# Patient Record
Sex: Male | Born: 1950 | Race: White | Hispanic: No | Marital: Married | Smoking: Never smoker
Health system: Southern US, Community
[De-identification: ages and names within clinical notes are randomized; demographics above are authoritative.]

## PROBLEM LIST (undated history)

## (undated) DIAGNOSIS — Z9889 Other specified postprocedural states: Secondary | ICD-10-CM

## (undated) DIAGNOSIS — G4730 Sleep apnea, unspecified: Secondary | ICD-10-CM

## (undated) DIAGNOSIS — Z8679 Personal history of other diseases of the circulatory system: Secondary | ICD-10-CM

## (undated) DIAGNOSIS — K5790 Diverticulosis of intestine, part unspecified, without perforation or abscess without bleeding: Secondary | ICD-10-CM

## (undated) DIAGNOSIS — K229 Disease of esophagus, unspecified: Secondary | ICD-10-CM

## (undated) DIAGNOSIS — G4733 Obstructive sleep apnea (adult) (pediatric): Secondary | ICD-10-CM

## (undated) DIAGNOSIS — I10 Essential (primary) hypertension: Secondary | ICD-10-CM

## (undated) DIAGNOSIS — N400 Enlarged prostate without lower urinary tract symptoms: Secondary | ICD-10-CM

## (undated) DIAGNOSIS — H9192 Unspecified hearing loss, left ear: Secondary | ICD-10-CM

## (undated) DIAGNOSIS — D126 Benign neoplasm of colon, unspecified: Secondary | ICD-10-CM

## (undated) DIAGNOSIS — E785 Hyperlipidemia, unspecified: Secondary | ICD-10-CM

## (undated) DIAGNOSIS — R112 Nausea with vomiting, unspecified: Secondary | ICD-10-CM

## (undated) DIAGNOSIS — E039 Hypothyroidism, unspecified: Secondary | ICD-10-CM

## (undated) DIAGNOSIS — R011 Cardiac murmur, unspecified: Secondary | ICD-10-CM

## (undated) HISTORY — DX: Hypothyroidism, unspecified: E03.9

## (undated) HISTORY — DX: Benign prostatic hyperplasia without lower urinary tract symptoms: N40.0

## (undated) HISTORY — PX: TONSILLECTOMY: SUR1361

## (undated) HISTORY — DX: Other specified postprocedural states: Z98.890

## (undated) HISTORY — DX: Unspecified hearing loss, left ear: H91.92

## (undated) HISTORY — DX: Nausea with vomiting, unspecified: R11.2

## (undated) HISTORY — PX: WISDOM TOOTH EXTRACTION: SHX21

## (undated) HISTORY — DX: Disease of esophagus, unspecified: K22.9

## (undated) HISTORY — DX: Benign neoplasm of colon, unspecified: D12.6

## (undated) HISTORY — DX: Essential (primary) hypertension: I10

## (undated) HISTORY — PX: MIDDLE EAR SURGERY: SHX713

## (undated) HISTORY — PX: HERNIA REPAIR: SHX51

## (undated) HISTORY — DX: Diverticulosis of intestine, part unspecified, without perforation or abscess without bleeding: K57.90

## (undated) HISTORY — DX: Obstructive sleep apnea (adult) (pediatric): G47.33

## (undated) HISTORY — PX: OTHER SURGICAL HISTORY: SHX169

## (undated) HISTORY — PX: ORCHIECTOMY: SHX2116

## (undated) HISTORY — PX: KNEE ARTHROSCOPY: SUR90

## (undated) HISTORY — PX: INNER EAR SURGERY: SHX679

## (undated) HISTORY — DX: Cardiac murmur, unspecified: R01.1

## (undated) HISTORY — DX: Hyperlipidemia, unspecified: E78.5

## (undated) HISTORY — DX: Sleep apnea, unspecified: G47.30

## (undated) HISTORY — DX: Personal history of other diseases of the circulatory system: Z86.79

---

## 1997-11-20 ENCOUNTER — Encounter: Payer: Self-pay | Admitting: Gastroenterology

## 1997-11-20 ENCOUNTER — Ambulatory Visit (HOSPITAL_COMMUNITY): Admission: RE | Admit: 1997-11-20 | Discharge: 1997-11-20 | Payer: Self-pay | Admitting: Gastroenterology

## 1997-11-27 ENCOUNTER — Encounter: Payer: Self-pay | Admitting: Gastroenterology

## 1998-04-24 HISTORY — PX: FLEXIBLE SIGMOIDOSCOPY: SHX1649

## 2002-02-04 ENCOUNTER — Encounter: Payer: Self-pay | Admitting: Internal Medicine

## 2004-10-31 ENCOUNTER — Ambulatory Visit: Payer: Self-pay | Admitting: Internal Medicine

## 2004-12-05 ENCOUNTER — Ambulatory Visit (HOSPITAL_BASED_OUTPATIENT_CLINIC_OR_DEPARTMENT_OTHER): Admission: RE | Admit: 2004-12-05 | Discharge: 2004-12-05 | Payer: Self-pay | Admitting: Internal Medicine

## 2004-12-07 ENCOUNTER — Ambulatory Visit: Payer: Self-pay | Admitting: Pulmonary Disease

## 2005-01-03 ENCOUNTER — Ambulatory Visit: Payer: Self-pay | Admitting: Internal Medicine

## 2005-01-06 ENCOUNTER — Ambulatory Visit: Payer: Self-pay | Admitting: Pulmonary Disease

## 2005-01-16 ENCOUNTER — Ambulatory Visit: Payer: Self-pay | Admitting: Internal Medicine

## 2005-02-07 ENCOUNTER — Ambulatory Visit: Payer: Self-pay | Admitting: Internal Medicine

## 2005-07-03 ENCOUNTER — Ambulatory Visit: Payer: Self-pay | Admitting: Internal Medicine

## 2005-07-21 ENCOUNTER — Ambulatory Visit: Payer: Self-pay | Admitting: Internal Medicine

## 2005-11-02 ENCOUNTER — Ambulatory Visit: Payer: Self-pay | Admitting: Internal Medicine

## 2005-11-14 ENCOUNTER — Ambulatory Visit: Payer: Self-pay | Admitting: Internal Medicine

## 2006-03-20 ENCOUNTER — Ambulatory Visit: Payer: Self-pay | Admitting: Internal Medicine

## 2006-03-20 LAB — CONVERTED CEMR LAB: TSH: 6.67 microintl units/mL — ABNORMAL HIGH (ref 0.35–5.50)

## 2006-04-05 ENCOUNTER — Ambulatory Visit: Payer: Self-pay | Admitting: Internal Medicine

## 2006-06-28 ENCOUNTER — Ambulatory Visit: Payer: Self-pay | Admitting: Internal Medicine

## 2006-10-12 ENCOUNTER — Telehealth (INDEPENDENT_AMBULATORY_CARE_PROVIDER_SITE_OTHER): Payer: Self-pay | Admitting: *Deleted

## 2006-11-06 ENCOUNTER — Encounter (INDEPENDENT_AMBULATORY_CARE_PROVIDER_SITE_OTHER): Payer: Self-pay | Admitting: *Deleted

## 2006-11-06 ENCOUNTER — Ambulatory Visit: Payer: Self-pay | Admitting: Internal Medicine

## 2006-11-06 DIAGNOSIS — R7989 Other specified abnormal findings of blood chemistry: Secondary | ICD-10-CM | POA: Insufficient documentation

## 2006-11-06 LAB — CONVERTED CEMR LAB
Bilirubin Urine: NEGATIVE
Specific Gravity, Urine: <1.005
Urobilinogen, UA: NEGATIVE
WBC Urine, dipstick: NEGATIVE
pH: 5

## 2006-11-16 ENCOUNTER — Encounter (INDEPENDENT_AMBULATORY_CARE_PROVIDER_SITE_OTHER): Payer: Self-pay | Admitting: *Deleted

## 2006-11-19 ENCOUNTER — Telehealth: Payer: Self-pay | Admitting: Internal Medicine

## 2007-01-21 ENCOUNTER — Telehealth (INDEPENDENT_AMBULATORY_CARE_PROVIDER_SITE_OTHER): Payer: Self-pay | Admitting: *Deleted

## 2007-01-22 ENCOUNTER — Ambulatory Visit: Payer: Self-pay | Admitting: Internal Medicine

## 2007-04-09 ENCOUNTER — Ambulatory Visit: Payer: Self-pay | Admitting: Internal Medicine

## 2007-04-22 ENCOUNTER — Encounter (INDEPENDENT_AMBULATORY_CARE_PROVIDER_SITE_OTHER): Payer: Self-pay | Admitting: *Deleted

## 2007-04-25 HISTORY — PX: COLONOSCOPY W/ POLYPECTOMY: SHX1380

## 2007-04-25 LAB — CONVERTED CEMR LAB
Cholesterol: 187 mg/dL (ref 0–200)
TSH: 4.35 microintl units/mL (ref 0.35–5.50)

## 2007-04-26 ENCOUNTER — Encounter (INDEPENDENT_AMBULATORY_CARE_PROVIDER_SITE_OTHER): Payer: Self-pay | Admitting: *Deleted

## 2007-05-08 ENCOUNTER — Telehealth (INDEPENDENT_AMBULATORY_CARE_PROVIDER_SITE_OTHER): Payer: Self-pay | Admitting: *Deleted

## 2007-06-28 ENCOUNTER — Ambulatory Visit: Payer: Self-pay | Admitting: Internal Medicine

## 2007-07-01 ENCOUNTER — Encounter (INDEPENDENT_AMBULATORY_CARE_PROVIDER_SITE_OTHER): Payer: Self-pay | Admitting: *Deleted

## 2007-07-02 ENCOUNTER — Encounter: Payer: Self-pay | Admitting: Internal Medicine

## 2007-07-05 ENCOUNTER — Ambulatory Visit: Payer: Self-pay | Admitting: Internal Medicine

## 2007-07-05 DIAGNOSIS — E039 Hypothyroidism, unspecified: Secondary | ICD-10-CM | POA: Insufficient documentation

## 2007-07-08 ENCOUNTER — Encounter (INDEPENDENT_AMBULATORY_CARE_PROVIDER_SITE_OTHER): Payer: Self-pay | Admitting: *Deleted

## 2007-10-16 ENCOUNTER — Ambulatory Visit: Payer: Self-pay | Admitting: Internal Medicine

## 2007-10-28 ENCOUNTER — Encounter (INDEPENDENT_AMBULATORY_CARE_PROVIDER_SITE_OTHER): Payer: Self-pay | Admitting: *Deleted

## 2007-10-28 LAB — CONVERTED CEMR LAB
ALT: 37 units/L (ref 0–53)
AST: 32 units/L (ref 0–37)
Bilirubin, Direct: 0.1 mg/dL (ref 0.0–0.3)
Cholesterol: 174 mg/dL (ref 0–200)
HDL: 47.5 mg/dL (ref >39.0)
Total Protein: 7.2 g/dL (ref 6.0–8.3)
VLDL: 10 mg/dL (ref 0–40)

## 2007-11-08 ENCOUNTER — Ambulatory Visit: Payer: Self-pay | Admitting: Internal Medicine

## 2007-11-08 ENCOUNTER — Encounter (INDEPENDENT_AMBULATORY_CARE_PROVIDER_SITE_OTHER): Payer: Self-pay | Admitting: *Deleted

## 2007-11-08 DIAGNOSIS — E785 Hyperlipidemia, unspecified: Secondary | ICD-10-CM | POA: Insufficient documentation

## 2007-11-22 ENCOUNTER — Encounter (INDEPENDENT_AMBULATORY_CARE_PROVIDER_SITE_OTHER): Payer: Self-pay | Admitting: *Deleted

## 2007-11-22 ENCOUNTER — Ambulatory Visit: Payer: Self-pay | Admitting: Internal Medicine

## 2007-11-22 LAB — CONVERTED CEMR LAB: OCCULT 2: NEGATIVE

## 2007-12-09 ENCOUNTER — Telehealth: Payer: Self-pay | Admitting: Internal Medicine

## 2007-12-24 ENCOUNTER — Telehealth (INDEPENDENT_AMBULATORY_CARE_PROVIDER_SITE_OTHER): Payer: Self-pay | Admitting: *Deleted

## 2008-01-13 ENCOUNTER — Telehealth (INDEPENDENT_AMBULATORY_CARE_PROVIDER_SITE_OTHER): Payer: Self-pay | Admitting: *Deleted

## 2008-01-23 DIAGNOSIS — D126 Benign neoplasm of colon, unspecified: Secondary | ICD-10-CM

## 2008-01-23 HISTORY — DX: Benign neoplasm of colon, unspecified: D12.6

## 2008-02-10 ENCOUNTER — Ambulatory Visit: Payer: Self-pay | Admitting: Gastroenterology

## 2008-02-12 ENCOUNTER — Ambulatory Visit: Payer: Self-pay | Admitting: Gastroenterology

## 2008-02-12 ENCOUNTER — Encounter: Payer: Self-pay | Admitting: Gastroenterology

## 2008-02-13 ENCOUNTER — Encounter: Payer: Self-pay | Admitting: Gastroenterology

## 2008-02-14 ENCOUNTER — Telehealth: Payer: Self-pay | Admitting: Gastroenterology

## 2008-08-05 ENCOUNTER — Telehealth (INDEPENDENT_AMBULATORY_CARE_PROVIDER_SITE_OTHER): Payer: Self-pay | Admitting: *Deleted

## 2008-11-09 ENCOUNTER — Ambulatory Visit: Payer: Self-pay | Admitting: Internal Medicine

## 2008-11-09 DIAGNOSIS — B360 Pityriasis versicolor: Secondary | ICD-10-CM | POA: Insufficient documentation

## 2008-11-09 DIAGNOSIS — Z860100 Personal history of colon polyps, unspecified: Secondary | ICD-10-CM | POA: Insufficient documentation

## 2008-11-09 DIAGNOSIS — K5730 Diverticulosis of large intestine without perforation or abscess without bleeding: Secondary | ICD-10-CM | POA: Insufficient documentation

## 2008-11-09 DIAGNOSIS — R9431 Abnormal electrocardiogram [ECG] [EKG]: Secondary | ICD-10-CM | POA: Insufficient documentation

## 2008-11-18 ENCOUNTER — Encounter (INDEPENDENT_AMBULATORY_CARE_PROVIDER_SITE_OTHER): Payer: Self-pay | Admitting: *Deleted

## 2008-11-23 ENCOUNTER — Encounter (INDEPENDENT_AMBULATORY_CARE_PROVIDER_SITE_OTHER): Payer: Self-pay | Admitting: *Deleted

## 2009-11-12 ENCOUNTER — Ambulatory Visit: Payer: Self-pay | Admitting: Internal Medicine

## 2010-05-22 LAB — CONVERTED CEMR LAB
ALT: 22 units/L (ref 0–53)
AST: 22 units/L (ref 0–37)
AST: 30 units/L (ref 0–37)
Albumin: 4.2 g/dL (ref 3.5–5.2)
Alkaline Phosphatase: 50 units/L (ref 39–117)
BUN: 13 mg/dL (ref 6–23)
Basophils Absolute: 0 10*3/uL (ref 0.0–0.1)
Basophils Absolute: 0 10*3/uL (ref 0.0–0.1)
Basophils Relative: 0.2 % (ref 0.0–3.0)
Basophils Relative: 1.1 % (ref 0.0–3.0)
Bilirubin, Direct: 0 mg/dL (ref 0.0–0.3)
Bilirubin, Direct: 0.1 mg/dL (ref 0.0–0.3)
Blood in Urine, dipstick: NEGATIVE
CO2: 31 meq/L (ref 19–32)
CO2: 32 meq/L (ref 19–32)
Calcium: 8.8 mg/dL (ref 8.4–10.5)
Calcium: 9 mg/dL (ref 8.4–10.5)
Chloride: 105 meq/L (ref 96–112)
Cholesterol, target level: 200 mg/dL
Creatinine, Ser: 0.8 mg/dL (ref 0.4–1.5)
Creatinine, Ser: 1 mg/dL (ref 0.4–1.5)
Eosinophils Absolute: 0.1 10*3/uL (ref 0.0–0.6)
Eosinophils Absolute: 0.1 10*3/uL (ref 0.0–0.7)
Eosinophils Absolute: 0.1 10*3/uL (ref 0.0–0.7)
Eosinophils Relative: 1.9 % (ref 0.0–5.0)
Eosinophils Relative: 2 % (ref 0.0–5.0)
Eosinophils Relative: 2.7 % (ref 0.0–5.0)
GFR calc Af Amer: 100 mL/min
GFR calc Af Amer: 130 mL/min
GFR calc non Af Amer: 106.13 mL/min (ref >60)
GFR calc non Af Amer: 107 mL/min
GFR calc non Af Amer: 108.88 mL/min (ref >60)
GFR calc non Af Amer: 82 mL/min
Glucose, Bld: 100 mg/dL — ABNORMAL HIGH (ref 70–99)
HCT: 41.3 % (ref 39.0–52.0)
HCT: 42.3 % (ref 39.0–52.0)
HCT: 44.9 % (ref 39.0–52.0)
HDL: 54.5 mg/dL (ref >39.00)
Hemoglobin: 14.3 g/dL (ref 13.0–17.0)
Hemoglobin: 14.4 g/dL (ref 13.0–17.0)
Hemoglobin: 14.7 g/dL (ref 13.0–17.0)
Hgb A1c MFr Bld: 5.7 % (ref 4.6–6.0)
Hgb A1c MFr Bld: 5.9 % (ref 4.6–6.0)
Ketones, urine, test strip: NEGATIVE
LDL Cholesterol: 105 mg/dL — ABNORMAL HIGH (ref 0–99)
LDL Cholesterol: 96 mg/dL (ref 0–99)
Lymphocytes Relative: 27.8 % (ref 12.0–46.0)
Lymphocytes Relative: 28.9 % (ref 12.0–46.0)
Lymphs Abs: 1.5 10*3/uL (ref 0.7–4.0)
MCHC: 33.3 g/dL (ref 30.0–36.0)
MCHC: 34.4 g/dL (ref 30.0–36.0)
MCV: 95.7 fL (ref 78.0–100.0)
MCV: 98.7 fL (ref 78.0–100.0)
Monocytes Absolute: 0.5 10*3/uL (ref 0.1–1.0)
Monocytes Absolute: 0.6 10*3/uL (ref 0.2–0.7)
Monocytes Relative: 12.4 % — ABNORMAL HIGH (ref 3.0–12.0)
Monocytes Relative: 15 % — ABNORMAL HIGH (ref 3.0–12.0)
Neutro Abs: 2.1 10*3/uL (ref 1.4–7.7)
Neutro Abs: 2.2 10*3/uL (ref 1.4–7.7)
Neutro Abs: 2.6 10*3/uL (ref 1.4–7.7)
Neutrophils Relative %: 52.4 % (ref 43.0–77.0)
Neutrophils Relative %: 54.5 % (ref 43.0–77.0)
Neutrophils Relative %: 55.3 % (ref 43.0–77.0)
Nitrite: NEGATIVE
Platelets: 149 10*3/uL — ABNORMAL LOW (ref 150–400)
Potassium: 3.8 meq/L (ref 3.5–5.1)
Potassium: 4.2 meq/L (ref 3.5–5.1)
RBC: 4.25 M/uL (ref 4.22–5.81)
RBC: 4.36 M/uL (ref 4.22–5.81)
RBC: 4.54 M/uL (ref 4.22–5.81)
Sodium: 140 meq/L (ref 135–145)
Sodium: 141 meq/L (ref 135–145)
Sodium: 143 meq/L (ref 135–145)
TSH: 3.38 microintl units/mL (ref 0.35–5.50)
TSH: 4.11 microintl units/mL (ref 0.35–5.50)
TSH: 5.92 microintl units/mL — ABNORMAL HIGH (ref 0.35–5.50)
Total Bilirubin: 0.9 mg/dL (ref 0.3–1.2)
Total CHOL/HDL Ratio: 3
Total CHOL/HDL Ratio: 3
Total Protein: 6.9 g/dL (ref 6.0–8.3)
Total Protein: 7.4 g/dL (ref 6.0–8.3)
Triglycerides: 41 mg/dL (ref 0.0–149.0)
Triglycerides: 63 mg/dL (ref 0–149)
Urobilinogen, UA: 0.2
VLDL: 8.2 mg/dL (ref 0.0–40.0)
VLDL: 9 mg/dL (ref 0.0–40.0)
WBC: 3.8 10*3/uL — ABNORMAL LOW (ref 4.5–10.5)
WBC: 4 10*3/uL — ABNORMAL LOW (ref 4.5–10.5)
WBC: 4.8 10*3/uL (ref 4.5–10.5)
pH: 6

## 2010-05-24 NOTE — Assessment & Plan Note (Signed)
Summary: CPX/NS/KDC   Vital Signs:  Patient profile:   60 year old male Height:      75 inches Weight:      180.8 pounds BMI:     22.68 Temp:     98.3 degrees F oral Pulse rate:   84 / minute Resp:     16 per minute BP sitting:   118 / 70  (left arm) Cuff size:   large  Vitals Entered By: Shonna Chock CMA (November 12, 2009 11:03 AM) CC: CPX with fasting labs , Lipid Management Comments **Patient may have double Cardura and Parvachol, patient not sure if he took it x 2 on accident**   Primary Care Provider:  Marga Melnick, MD  CC:  CPX with fasting labs  and Lipid Management.  History of Present Illness: Mike Stephens is here for a physical; he is essentially asymptomatic . He did have a cough for a month (May) after attic exposure to Haematologist.  Lipid Management History:      Positive NCEP/ATP III risk factors include male age 84 years old or older.  Negative NCEP/ATP III risk factors include non-diabetic, no family history for ischemic heart disease, non-tobacco-user status, non-hypertensive, no ASHD (atherosclerotic heart disease), no prior stroke/TIA, no peripheral vascular disease, and no history of aortic aneurysm.     Preventive Screening-Counseling & Management  Alcohol-Tobacco     Smoking Status: never  Caffeine-Diet-Exercise     Does Patient Exercise: yes  Allergies: 1)  ! * General Anesthesia  Past History:  Past Medical History: Syncope due to seasickness 2002 Hyperlipidemia: LDL goal = < 100 based on NMR Lipoprofile;[place] Study  LDL  goal < 160 Hypothyroidism Benign prostatic hypertrophy Mitral valve prolapse, PMH of (Note: clinically NO mitral  regurgitation) Diverticulosis Colonic polyps, PMH  of Diverticulosis, colon  Past Surgical History: TM  surgery 1997 mastoid surgery   as child  age 89 radical surgery  post infection  L ear flex sigmoidectomy  2000 for rectal bleeding arthroscopy R knee orchiectomy for undescended  testicle  age 89 wisdom teeth extraction Tonsillectomy Hernia Surgery Colon polypectomy  : tubular adenoma 2009, Dr Russella Dar, due 2014  Family History: Father: Tb Mother:HTN , malaria  Siblings: negative 2 M uncles: MI , older than  6 yo M uncle: colon cancer  Social History: Heart healthy diet Alcohol Use - no Occupation:self employed Never Smoked Regular exercise-yes: 60 min every other day   Review of Systems General:  Denies chills, fever, sweats, and weight loss. Eyes:  Denies blurring, double vision, and vision loss-both eyes. ENT:  Denies difficulty swallowing and hoarseness; Absent hearing L. CV:  Denies chest pain or discomfort, leg cramps with exertion, palpitations, shortness of breath with exertion, swelling of feet, and swelling of hands. Resp:  Denies chest pain with inspiration, cough, coughing up blood, shortness of breath, sputum productive, and wheezing. GI:  Denies abdominal pain, bloody stools, dark tarry stools, and indigestion. GU:  Denies discharge, dysuria, and hematuria. MS:  Denies joint redness, joint swelling, low back pain, mid back pain, and thoracic pain; Occasional L shoulder  pain positionally . Derm:  Denies changes in nail beds, dryness, and lesion(s). Neuro:  Denies disturbances in coordination, numbness, poor balance, tingling, and weakness. Psych:  Denies anxiety and depression. Endo:  Denies cold intolerance, excessive hunger, excessive thirst, excessive urination, and heat intolerance. Heme:  Denies abnormal bruising and bleeding. Allergy:  Denies itching eyes and sneezing.  Physical Exam  General:  Thin,well-nourished, alert,appropriate  and cooperative throughout examination Head:  Normocephalic and atraumatic without obvious abnormalities.  Pattern  alopecia; beard & moustache Eyes:  No corneal or conjunctival inflammation noted. Perrla. Funduscopic exam benign, without hemorrhages, exudates or papilledema. Ears:  Small perforation L TM; severe  scarring R TM Nose:  External nasal examination shows no deformity or inflammation. Nasal mucosa are pink and moist without lesions or exudates. Mouth:  Oral mucosa and oropharynx without lesions or exudates.  Teeth in good repair. Neck:  No deformities, masses, or tenderness noted. Physiologic asymmetry Lungs:  Normal respiratory effort, chest expands symmetrically. Lungs are clear to auscultation, no crackles or wheezes. Heart:  normal rate, regular rhythm, no gallop, no rub, no JVD, no HJR, and grade1/2 -1   /6 systolic murmur @ apex.   Abdomen:  Bowel sounds positive,abdomen soft and non-tender without masses, organomegaly or hernias noted. Rectal:  No external abnormalities noted. Normal sphincter tone. No rectal masses or tenderness. Genitalia:   Single testicle  descended without nodularity, tenderness or masses. No scrotal masses or lesions. No penis lesions or urethral discharge. Prostate:  Prostate gland firm and smooth, no enlargement, nodularity, tenderness, mass, asymmetry or induration. Msk:  No deformity or scoliosis noted of thoracic or lumbar spine.   Pulses:  R and L carotid,radial,dorsalis pedis and posterior tibial pulses are full and equal bilaterally Extremities:  No clubbing, cyanosis, edema, or deformity noted with normal full range of motion of all joints.   Neurologic:  alert & oriented X3 and DTRs symmetrical and normal.   Skin:  Intact without suspicious lesions or rashes Cervical Nodes:  No lymphadenopathy noted Axillary Nodes:  No palpable lymphadenopathy Inguinal Nodes:  No significant adenopathy Psych:  memory intact for recent and remote, normally interactive, and good eye contact.     Impression & Recommendations:  Problem # 1:  ROUTINE GENERAL MEDICAL EXAM@HEALTH  CARE FACL (ICD-V70.0)  Orders: Specimen Handling (81191) UA Dipstick w/o Micro (manual) (81002) EKG w/ Interpretation (93000) Venipuncture (47829) TLB-Lipid Panel (80061-LIPID) TLB-BMP (Basic  Metabolic Panel-BMET) (80048-METABOL) TLB-CBC Platelet - w/Differential (85025-CBCD) TLB-Hepatic/Liver Function Pnl (80076-HEPATIC) TLB-TSH (Thyroid Stimulating Hormone) (84443-TSH) TLB-PSA (Prostate Specific Antigen) (84153-PSA) T-2 View CXR (71020TC)  Problem # 2:  COUGH (ICD-786.2)  post environmental exposures  Orders: T-2 View CXR (71020TC)  Problem # 3:  HYPERLIPIDEMIA (ICD-272.4)  His updated medication list for this problem includes:    Pravachol 40 Mg Tabs (Pravastatin sodium) .Marland Kitchen... 1 qhs  Problem # 4:  HYPOTHYROIDISM (ICD-244.9)  His updated medication list for this problem includes:    Levoxyl 25 Mcg Tabs (Levothyroxine sodium) .Marland Kitchen... 1.5 pills once daily except 1on weds  Problem # 5:  COLONIC POLYPS, HX OF (ICD-V12.72) as per Dr Russella Dar  Complete Medication List: 1)  Levoxyl 25 Mcg Tabs (Levothyroxine sodium) .... 1.5 pills once daily except 1on weds 2)  Cardura 8 Mg Tabs (Doxazosin mesylate) .... 1/4 tab qd 3)  Coq10 100 Mg Caps (Coenzyme q10) .Marland Kitchen.. 1 by mouth once daily 4)  Fish Oil 1200 Mg Caps (Omega-3 fatty acids) .Marland Kitchen.. 1 by mouth once daily 5)  Calcium 500mg   6)  Selenium  7)  Multivitamin  8)  Asa 325mg   9)  Fiber Qd-bid  10)  Pravachol 40 Mg Tabs (Pravastatin sodium) .Marland Kitchen.. 1 qhs 11)  Vitamin D3 2000 Unit Caps (Cholecalciferol) .Marland Kitchen.. 1 by mouth once daily 12)  Transderm-scop 1.5 Mg Pt72 (Scopolamine base) .... Apply patch 24 hours before procedure  Lipid Assessment/Plan:      Based on  NCEP/ATP III, the patient's risk factor category is "0-1 risk factors".  The patient's lipid goals are as follows: Total cholesterol goal is 200; LDL cholesterol goal is 100; HDL cholesterol goal is 40; Triglyceride goal is 150.  His LDL cholesterol goal has not been met.  Secondary causes for hyperlipidemia have been ruled out.  He has been counseled on adjunctive measures for lowering his cholesterol and has been provided with dietary instructions.    Patient  Instructions: 1)  Wear protective mask when in attic. Prescriptions: PRAVACHOL 40 MG  TABS (PRAVASTATIN SODIUM) 1 qhs  #90 x 3   Entered and Authorized by:   Marga Melnick MD   Signed by:   Marga Melnick MD on 11/12/2009   Method used:   Print then Give to Patient   RxID:   [id number] CARDURA 8 MG  TABS (DOXAZOSIN MESYLATE) 1/4 tab qd  #90 Each x 3   Entered and Authorized by:   Marga Melnick MD   Signed by:   Marga Melnick MD on 11/12/2009   Method used:   Print then Give to Patient   RxID:   [id number] LEVOXYL 25 MCG TABS (LEVOTHYROXINE SODIUM) 1.5 pills once daily EXCEPT 1on Weds  #90 Each x 3   Entered and Authorized by:   Marga Melnick MD   Signed by:   Marga Melnick MD on 11/12/2009   Method used:   Print then Give to Patient   RxID:   [id number]     Laboratory Results   Urine Tests    Routine Urinalysis   Glucose: negative   (Normal Range: Negative) Bilirubin: negative   (Normal Range: Negative) Ketone: negative   (Normal Range: Negative) Spec. Gravity: 1.015   (Normal Range: 1.003-1.035) Blood: negative   (Normal Range: Negative) pH: 6.0   (Normal Range: 5.0-8.0) Protein: negative   (Normal Range: Negative) Urobilinogen: 0.2   (Normal Range: 0-1) Nitrite: negative   (Normal Range: Negative) Leukocyte Esterace: negative   (Normal Range: Negative)

## 2010-09-09 NOTE — Procedures (Signed)
NAMEMARKOS, THEIL NO.:  [id number]   MEDICAL RECORD NO.:  [id number]          PATIENT TYPE:  OUT   LOCATION:  SLEEP CENTER                 FACILITY:  [facility]   PHYSICIAN:  Marcelyn Bruins, M.D. [facility] DATE OF BIRTH:  09-Nov-1950   DATE OF STUDY:  12/05/2004                              NOCTURNAL POLYSOMNOGRAM   DATE OF STUDY:  December 05, 2004.   REFERRING PHYSICIAN:  Dr. Marga Melnick.   INDICATION FOR STUDY:  Hypersomnia with a sleep apnea. Epworth score: 1.   SLEEP ARCHITECTURE:  The patient had total sleep time of 207 minutes with  significantly decreased slow wave sleep and REM. Sleep onset latency was  prolonged at 53 minutes and REM onset was prolonged at 145 minutes. Sleep  efficiency was only 56%.   IMPRESSION:  1.  Moderate obstructive sleep apnea/hypopnea syndrome with a respiratory      disturbance index of 25 events per hour and O2 desaturation as low as      87%. The events were not positional. Treatment for this degree of sleep      apnea may include weight loss alone, upper airway surgery, oral      appliance, and C-PAP. Clinical correlation is suggested.  2.  Moderate snoring noted throughout.  3.  No clinically significant cardiac arrhythmias.           ______________________________  Marcelyn Bruins, M.D. [facility]  Diplomate, American Board of Sleep  Medicine     KC/MEDQ  D:  12/08/2004 16:54:56  T:  12/08/2004 23:24:18  Job:  [id number]

## 2010-12-22 ENCOUNTER — Ambulatory Visit (INDEPENDENT_AMBULATORY_CARE_PROVIDER_SITE_OTHER): Payer: BC Managed Care – PPO | Admitting: Internal Medicine

## 2010-12-22 ENCOUNTER — Encounter: Payer: Self-pay | Admitting: Internal Medicine

## 2010-12-22 VITALS — BP 130/76 | HR 80 | Temp 97.9°F | Resp 12 | Ht 75.0 in | Wt 177.0 lb

## 2010-12-22 DIAGNOSIS — E039 Hypothyroidism, unspecified: Secondary | ICD-10-CM

## 2010-12-22 DIAGNOSIS — G4730 Sleep apnea, unspecified: Secondary | ICD-10-CM

## 2010-12-22 DIAGNOSIS — Z23 Encounter for immunization: Secondary | ICD-10-CM

## 2010-12-22 DIAGNOSIS — G4733 Obstructive sleep apnea (adult) (pediatric): Secondary | ICD-10-CM | POA: Insufficient documentation

## 2010-12-22 DIAGNOSIS — Z86010 Personal history of colonic polyps: Secondary | ICD-10-CM

## 2010-12-22 DIAGNOSIS — Z2911 Encounter for prophylactic immunotherapy for respiratory syncytial virus (RSV): Secondary | ICD-10-CM

## 2010-12-22 DIAGNOSIS — E785 Hyperlipidemia, unspecified: Secondary | ICD-10-CM

## 2010-12-22 DIAGNOSIS — Z0000 Encounter for general adult medical examination without abnormal findings: Secondary | ICD-10-CM

## 2010-12-22 LAB — LIPID PANEL
HDL: 55 mg/dL (ref >39.00)
LDL Cholesterol: 108 mg/dL — ABNORMAL HIGH (ref 0–99)
Total CHOL/HDL Ratio: 3
Triglycerides: 51 mg/dL (ref 0.0–149.0)

## 2010-12-22 LAB — BASIC METABOLIC PANEL
CO2: 31 mEq/L (ref 19–32)
Calcium: 9.4 mg/dL (ref 8.4–10.5)
Chloride: 103 mEq/L (ref 96–112)
Glucose, Bld: 116 mg/dL — ABNORMAL HIGH (ref 70–99)
Sodium: 141 mEq/L (ref 135–145)

## 2010-12-22 LAB — PSA: PSA: 0.93 ng/mL (ref 0.10–4.00)

## 2010-12-22 LAB — CBC WITH DIFFERENTIAL/PLATELET
Basophils Relative: 0.5 % (ref 0.0–3.0)
Eosinophils Relative: 1.7 % (ref 0.0–5.0)
Hemoglobin: 14.9 g/dL (ref 13.0–17.0)
Lymphocytes Relative: 30.2 % (ref 12.0–46.0)
MCV: 97.9 fl (ref 78.0–100.0)
Neutro Abs: 2.5 10*3/uL (ref 1.4–7.7)
Neutrophils Relative %: 56.2 % (ref 43.0–77.0)
RBC: 4.57 Mil/uL (ref 4.22–5.81)
WBC: 4.5 10*3/uL (ref 4.5–10.5)

## 2010-12-22 LAB — HEPATIC FUNCTION PANEL
Albumin: 4.7 g/dL (ref 3.5–5.2)
Total Protein: 7.3 g/dL (ref 6.0–8.3)

## 2010-12-22 NOTE — Progress Notes (Signed)
Subjective:    Patient ID: Mike Stephens, male    DOB: 1950/08/19, 60 y.o.   MRN: [id number]  HPI  Mike Stephens  is here for a physical; he denies acute issues.     Review of Systems Patient reports no  vision/ hearing changes,anorexia, weight change, fever ,adenopathy, persistant / recurrent hoarseness, swallowing issues, chest pain,palpitations, edema,persistant / recurrent cough, hemoptysis, dyspnea(rest, exertional, paroxysmal nocturnal), gastrointestinal  bleeding (melena, rectal bleeding), abdominal pain, excessive heart burn, GU symptoms( dysuria, hematuria, pyuria, voiding/incontinence  issues) syncope, focal weakness, memory loss,numbness & tingling, skin/hair/nail changes,depression, anxiety, abnormal bruising/bleeding, or  musculoskeletal symptoms/signs.      Objective:   Physical Exam Gen.: Thin but healthy and well-nourished in appearance. Alert, appropriate and cooperative throughout exam. Head: Normocephalic without obvious abnormalities; pattern alopecia ; moustache & beard Eyes: No corneal or conjunctival inflammation noted. Pupils equal round reactive to light and accommodation. Fundal exam is benign without hemorrhages, exudate, papilledema. Extraocular motion intact. Vision grossly normal with lenses. Ears: External  ear exam reveals no significant lesions or deformities. Canals clear .TMs scarred, L >R. Hearing is grossly decreased on L Nose: External nasal exam reveals no deformity or inflammation. Nasal mucosa are pink and moist. No lesions or exudates noted.  Mouth: Oral mucosa and oropharynx reveal no lesions or exudates. Teeth in good repair. Neck: No deformities, masses, or tenderness noted. Range of motion &. Thyroid normal. Lungs: Normal respiratory effort; chest expands symmetrically. Lungs are clear to auscultation without rales, wheezes, or increased work of breathing. Heart: Normal rate and rhythm. Normal S1 and S2. No gallop,  or rub. Apical click w/o   murmur. Abdomen: Bowel sounds normal; abdomen soft and nontender. No masses, organomegaly or hernias noted. Genitalia/DRE: single testicle present; left lobe of the prostate is minimally enlarged without nodules or induration. .                                                                                   Musculoskeletal/extremities: No deformity or scoliosis noted of  the thoracic or lumbar spine. No clubbing, cyanosis, edema, or deformity noted. Range of motion  normal .Tone & strength  normal.Joints normal. Nail health  good. Vascular: Carotid, radial artery, dorsalis pedis and  posterior tibial pulses are full and equal. No bruits present. Neurologic: Alert and oriented x3. Deep tendon reflexes symmetrical and normal.          Skin: Intact without suspicious lesions or rashes. Lymph: No cervical, axillary, or inguinal lymphadenopathy present. Psych: Mood and affect are normal. Normally interactive                                                                                         Assessment & Plan:  #1 comprehensive physical exam; no acute findings #2 see Problem List with Assessments & Recommendations Plan:  see Orders   Note: He has poor R-wave progression V1-V2 which is stable. There are no ischemic EKG changes present.

## 2010-12-22 NOTE — Progress Notes (Signed)
Addended by: Edgardo Roys on: 12/22/2010 11:22 AM   Modules accepted: Orders

## 2010-12-22 NOTE — Patient Instructions (Signed)
Preventive Health Care: Exercise at least 30-45 minutes a day,  3-4 days a week.  Eat a low-fat diet with lots of fruits and vegetables, up to 7-9 servings per day. Consume less than 40 grams of sugar per day from foods & drinks with High Fructose Corn Sugar as # 1,2,3 or # 4 on label. Health Care Power of Attorney & Living Will. Complete if not in place ; these place you in charge of your health care decisions. 

## 2010-12-23 LAB — HEMOGLOBIN A1C: Hgb A1c MFr Bld: 6 % (ref 4.6–6.5)

## 2010-12-27 ENCOUNTER — Encounter: Payer: Self-pay | Admitting: *Deleted

## 2011-01-02 ENCOUNTER — Other Ambulatory Visit: Payer: Self-pay | Admitting: Internal Medicine

## 2011-01-13 ENCOUNTER — Other Ambulatory Visit: Payer: Self-pay | Admitting: Internal Medicine

## 2011-04-14 ENCOUNTER — Ambulatory Visit (INDEPENDENT_AMBULATORY_CARE_PROVIDER_SITE_OTHER): Payer: BC Managed Care – PPO | Admitting: Family Medicine

## 2011-04-14 ENCOUNTER — Encounter: Payer: Self-pay | Admitting: Family Medicine

## 2011-04-14 VITALS — BP 130/80 | HR 75 | Temp 97.7°F | Ht 74.5 in | Wt 185.6 lb

## 2011-04-14 DIAGNOSIS — H6690 Otitis media, unspecified, unspecified ear: Secondary | ICD-10-CM

## 2011-04-14 MED ORDER — AMOXICILLIN 500 MG PO CAPS: 500.0000 mg | ORAL_CAPSULE | Freq: Two times a day (BID) | ORAL | Status: AC

## 2011-04-14 NOTE — Assessment & Plan Note (Signed)
R OM.  Start abx.  Reviewed supportive care and red flags that should prompt return.  Pt expressed understanding and is in agreement w/ plan.  

## 2011-04-14 NOTE — Progress Notes (Signed)
  Subjective:    Patient ID: Mike Stephens, male    DOB: 09/01/1950, 60 y.o.   MRN: [id number]  HPI URI- daughter has PNA.  10 days ago developed cough.  Tuesday had subjective fever.  Cough increased.  Cough improved this AM.  No sore throat.  + nasal congestion.  R ear pain.  Denies facial pain.  + HA.   Review of Systems For ROS see HPI     Objective:   Physical Exam  Vitals reviewed. Constitutional: He appears well-developed and well-nourished. No distress.  HENT:  Head: Normocephalic and atraumatic.  Right Ear: Tympanic membrane normal.  Left Ear: Tympanic membrane normal.  Nose: Nose normal. No mucosal edema or rhinorrhea. Right sinus exhibits no maxillary sinus tenderness and no frontal sinus tenderness. Left sinus exhibits no maxillary sinus tenderness and no frontal sinus tenderness.  Mouth/Throat: Oropharynx is clear and moist and mucous membranes are normal. No oropharyngeal exudate, posterior oropharyngeal edema or posterior oropharyngeal erythema.       R TM dull w/ poor landmarks, visible fluid L TM w/ extensive scarring but otherwise normal + PND No pharyngeal erythema, edema, or exudate  Eyes: Conjunctivae and EOM are normal. Pupils are equal, round, and reactive to light.  Neck: Normal range of motion. Neck supple.  Cardiovascular: Normal rate, regular rhythm and normal heart sounds.   Pulmonary/Chest: Effort normal and breath sounds normal. No respiratory distress. He has no wheezes.       no cough heard  Lymphadenopathy:    He has no cervical adenopathy.  Skin: Skin is warm and dry.          Assessment & Plan:

## 2011-04-14 NOTE — Patient Instructions (Signed)
You have a R ear infection Start the Amoxicillin as directed- take w/ food to avoid upset stomach Add mucinex to thin your nasal and chest congestion Drink plenty of fluids! REST! Alternate tylenol and ibuprofen for pain relief Call with any questions or concerns Hang in there! Happy Holidays!

## 2011-04-24 ENCOUNTER — Telehealth: Payer: Self-pay | Admitting: *Deleted

## 2011-04-24 NOTE — Telephone Encounter (Signed)
Course of Amox should have treated any bacterial illness.  If chest congestion remains- this is likely viral.  Should take Mucinex to thin the congestion, drink plenty of fluids, and use OTC cough meds prn.  No refills on abx.  Would need OV or see UC in [facility]

## 2011-04-24 NOTE — Telephone Encounter (Signed)
Spoke to pt to advise results/instructions. Pt understood.  

## 2011-04-24 NOTE — Telephone Encounter (Signed)
Last OV 04-14-11 for [employer] pt advised he is better however still notes some chest congestion, he has one more Amoxicillin left can he get a refill on this medication called in to the [facility] on [address] in [place] where he is currently staying?

## 2011-04-25 HISTORY — PX: COLONOSCOPY: SHX174

## 2011-06-18 ENCOUNTER — Other Ambulatory Visit: Payer: Self-pay | Admitting: Internal Medicine

## 2011-09-12 ENCOUNTER — Other Ambulatory Visit: Payer: Self-pay | Admitting: Internal Medicine

## 2011-09-29 ENCOUNTER — Telehealth: Payer: Self-pay | Admitting: Internal Medicine

## 2011-09-29 ENCOUNTER — Other Ambulatory Visit: Payer: Self-pay | Admitting: Internal Medicine

## 2011-09-29 DIAGNOSIS — D126 Benign neoplasm of colon, unspecified: Secondary | ICD-10-CM

## 2011-09-29 NOTE — Telephone Encounter (Signed)
Patient called and stated Mike Stephens, recommended he follow up from his colonoscopy 3-yrs after having it due to the type of polyup he head. Patient is now ready to do this and would like a referral to Dr. Laban Emperor again for this procedure, please call patient at [phone]

## 2011-09-29 NOTE — Telephone Encounter (Signed)
Dr.Hopper please advise 

## 2011-10-03 ENCOUNTER — Encounter: Payer: Self-pay | Admitting: Gastroenterology

## 2011-11-08 ENCOUNTER — Ambulatory Visit (INDEPENDENT_AMBULATORY_CARE_PROVIDER_SITE_OTHER): Payer: BC Managed Care – PPO | Admitting: Gastroenterology

## 2011-11-08 ENCOUNTER — Encounter: Payer: Self-pay | Admitting: Gastroenterology

## 2011-11-08 VITALS — BP 104/66 | HR 80 | Ht 74.0 in | Wt 178.8 lb

## 2011-11-08 DIAGNOSIS — Z86010 Personal history of colonic polyps: Secondary | ICD-10-CM

## 2011-11-08 NOTE — Progress Notes (Signed)
History of Present Illness: This is a 61 year old male who has a history of one adenomatous colon polyp removed at colonoscopy in October 2009. He has no ongoing gastrointestinal complaints. He is here to discuss surveillance colonoscopy. He has multiple questions and concerns about adenomatous colon polyp follow up intervals, polyp removal techniques, accuracy of colonoscopy, accuracy of polyp removal, concerns about nausea related to sedation, concerns about "kink" in his neck shoulder him by anesthesia and the past. Appendectomy relates that his wife had a 2 inch sessile colon polyp that was cancerous. Denies weight loss, abdominal pain, constipation, diarrhea, change in stool caliber, melena, hematochezia, nausea, vomiting, dysphagia, reflux symptoms, chest pain.  Review of Systems: Pertinent positive and negative review of systems were noted in the above HPI section. All other review of systems were otherwise negative.  Current Medications, Allergies, Past Medical History, Past Surgical History, Family History and Social History were reviewed in [employer] record.  Physical Exam: General: Well developed , well nourished, no acute distress Head: Normocephalic and atraumatic Eyes:  sclerae anicteric, EOMI Ears: Normal auditory acuity Mouth: No deformity or lesions Neck: Supple, no masses or thyromegaly Lungs: Clear throughout to auscultation Heart: Regular rate and rhythm; no murmurs, rubs or bruits Abdomen: Soft, non tender and non distended. No masses, hepatosplenomegaly or hernias noted. Normal Bowel sounds Rectal: deferred to colonoscopy Musculoskeletal: Symmetrical with no gross deformities  Skin: No lesions on visible extremities Pulses:  Normal pulses noted Extremities: No clubbing, cyanosis, edema or deformities noted Neurological: Alert oriented x 4, grossly nonfocal Cervical Nodes:  No significant cervical adenopathy Inguinal Nodes: No significant inguinal  adenopathy Psychological:  Alert and cooperative. Anxious.  Assessment and Recommendations:  1. Personal history of one, small adenomatous colon polyp. I attempted to answer all of his questions. I spent approximately 30 minutes answering his questions as outlined above to the best of my ability. The standard guideline for follow up of 1, small adenomatous colon polyp is 5 years. It is approximately 4 years since his last colonoscopy he is interested in proceeding which we will schedule. I will discuss with our CRNAs about scheduling this procedure in the [facility] or hospital based on his "kink" noted previously by anesthesia that may impact the ease of intubation if it is required. Intubation needed after moderate sedation for propofol sedation for endoscopic procedures is highly unlikely. He requests nausea medication with this procedure. He tolerated the last procedure without difficulty and he was given Phenergan. Anesthesia records supplied from arthroscopic knee surgery showed that he had Zofran previously which was effective as well. The risks, benefits, and alternatives to colonoscopy with possible biopsy and possible polypectomy were discussed with the patient and they consent to proceed.

## 2011-11-08 NOTE — Patient Instructions (Addendum)
We will call you back after discuss your case with our nurse anaesthetist to decide what sedation and the location of your Colonoscopy. If you do not hear from our office by next week then please call us back at [phone]. cc: Marga Melnick, MD

## 2011-11-23 ENCOUNTER — Other Ambulatory Visit: Payer: Self-pay | Admitting: Internal Medicine

## 2011-11-23 NOTE — Telephone Encounter (Signed)
TSH 244.9 

## 2011-12-19 ENCOUNTER — Encounter: Payer: Self-pay | Admitting: Gastroenterology

## 2011-12-19 ENCOUNTER — Ambulatory Visit (AMBULATORY_SURGERY_CENTER): Payer: BC Managed Care – PPO

## 2011-12-19 VITALS — Ht 74.0 in | Wt 176.6 lb

## 2011-12-19 DIAGNOSIS — Z800 Family history of malignant neoplasm of digestive organs: Secondary | ICD-10-CM

## 2011-12-19 DIAGNOSIS — Z1211 Encounter for screening for malignant neoplasm of colon: Secondary | ICD-10-CM

## 2011-12-19 DIAGNOSIS — Z86010 Personal history of colonic polyps: Secondary | ICD-10-CM

## 2011-12-19 MED ORDER — MOVIPREP 100 G PO SOLR: ORAL | Status: DC

## 2011-12-26 ENCOUNTER — Encounter: Payer: Self-pay | Admitting: Internal Medicine

## 2011-12-26 ENCOUNTER — Ambulatory Visit (INDEPENDENT_AMBULATORY_CARE_PROVIDER_SITE_OTHER): Payer: BC Managed Care – PPO | Admitting: Internal Medicine

## 2011-12-26 VITALS — BP 130/88 | HR 74 | Temp 98.1°F | Ht 75.08 in | Wt 175.8 lb

## 2011-12-26 DIAGNOSIS — M549 Dorsalgia, unspecified: Secondary | ICD-10-CM

## 2011-12-26 DIAGNOSIS — Z0000 Encounter for general adult medical examination without abnormal findings: Secondary | ICD-10-CM

## 2011-12-26 LAB — CBC WITH DIFFERENTIAL/PLATELET
Basophils Absolute: 0 10*3/uL (ref 0.0–0.1)
Eosinophils Relative: 1 % (ref 0.0–5.0)
HCT: 43.7 % (ref 39.0–52.0)
Lymphs Abs: 1.1 10*3/uL (ref 0.7–4.0)
MCV: 97.8 fl (ref 78.0–100.0)
Monocytes Absolute: 0.6 10*3/uL (ref 0.1–1.0)
Monocytes Relative: 12.7 % — ABNORMAL HIGH (ref 3.0–12.0)
Neutrophils Relative %: 61.3 % (ref 43.0–77.0)
Platelets: 149 10*3/uL — ABNORMAL LOW (ref 150.0–400.0)
RDW: 13.1 % (ref 11.5–14.6)
WBC: 4.5 10*3/uL (ref 4.5–10.5)

## 2011-12-26 LAB — HEPATIC FUNCTION PANEL
ALT: 24 U/L (ref 0–53)
Bilirubin, Direct: 0.1 mg/dL (ref 0.0–0.3)
Total Bilirubin: 0.9 mg/dL (ref 0.3–1.2)

## 2011-12-26 LAB — BASIC METABOLIC PANEL
CO2: 28 mEq/L (ref 19–32)
Chloride: 99 mEq/L (ref 96–112)
Creatinine, Ser: 0.8 mg/dL (ref 0.4–1.5)
GFR: 109.7 mL/min (ref >60.00)
Glucose, Bld: 103 mg/dL — ABNORMAL HIGH (ref 70–99)
Sodium: 136 mEq/L (ref 135–145)

## 2011-12-26 LAB — LIPID PANEL
Cholesterol: 159 mg/dL (ref 0–200)
LDL Cholesterol: 86 mg/dL (ref 0–99)
Total CHOL/HDL Ratio: 3
Triglycerides: 48 mg/dL (ref 0.0–149.0)
VLDL: 9.6 mg/dL (ref 0.0–40.0)

## 2011-12-26 NOTE — Progress Notes (Signed)
  Subjective:    Patient ID: Mike Stephens, male    DOB: 10-11-1950, 61 y.o.   MRN: [id number]  HPI  Mike Stephens is here for a physical;acute issues include intermittent throat clearing cough.      Review of Systems He does not have nasal congestion/obstruction; nasal purulence; facial pain; anosmia; fatigue; fever; headache; halitosis; earache and dental pain.   He does describe some right lumbosacral area pain intermittently related to repetitive motion. There is no associated limb weakness, numbness, or tingling. He denies any incontinence of urine or stool.    Objective:   Physical Exam Gen.: Thin but healthy and well-nourished in appearance. Alert, appropriate and cooperative throughout exam. Head: Normocephalic without obvious abnormalities;  pattern alopecia .Beard & moustache Eyes: No corneal or conjunctival inflammation noted. Pupils equal round reactive to light and accommodation. Fundal exam is benign without hemorrhages, exudate, papilledema. Extraocular motion intact. Vision grossly normal with lenses. Ears: External  ear exam reveals no significant lesions or deformities. Canals clear .TMs scarred ; R > L. Hearing is grossly decreased to whisper on L. Nose: External nasal exam reveals no deformity or inflammation. Nasal mucosa are pink and moist. No lesions or exudates noted.   Mouth: Oral mucosa and oropharynx reveal no lesions or exudates. Teeth in good repair. Neck: No deformities, masses, or tenderness noted. Range of motion & Thyroid  normal. Lungs: Normal respiratory effort; chest expands symmetrically. Lungs are clear to auscultation without rales, wheezes, or increased work of breathing. Heart: Normal rate and rhythm. Normal S1 and S2. No gallop, click, or rub. Very faint grade 1/2 systolic murmur at the base. S4 at the apex. Abdomen: Bowel sounds normal; abdomen soft and nontender. No masses, organomegaly or hernias noted. Genitalia/ DRE: Single testicle present; no  hernias.Prostate is normal without enlargement,  nodularity, or induration.  Minimal asymmetry , R > L Musculoskeletal/extremities: No deformity or scoliosis noted of  the thoracic or lumbar spine. No clubbing, cyanosis, edema, or deformity noted. Range of motion  normal .Tone & strength  normal.Joints normal. Nail health  good. He is able to lie flat and sit up without help. Straight leg raising is negative to 90 bilaterally. Vascular: Carotid, radial artery, dorsalis pedis and  posterior tibial pulses are full and equal. No bruits present. Neurologic: Alert and oriented x3. Deep tendon reflexes symmetrical and normal.          Skin: Intact without suspicious lesions. Mild perianal irritation Lymph: No cervical, axillary, or inguinal lymphadenopathy present. Psych: Mood and affect are normal. Normally interactive                                                                                         Assessment & Plan:  #1 comprehensive physical exam; no acute findings #2 see Problem List with Assessments & Recommendations Plan: see Orders

## 2011-12-26 NOTE — Patient Instructions (Addendum)
Preventive Health Care: Exercise at least 30-45 minutes a day,  3-4 days a week.  Eat a low-fat diet with lots of fruits and vegetables, up to 7-9 servings per day.  Consume less than 40 grams (preferably ZERO) of sugar per day from foods & drinks with High Fructose Corn Sugar as # 1,2,3 or # 4 on label. The best exercises for the low back include freestyle swimming, stretch aerobics, and yoga.   If you activate My Chart; the results can be released to you as soon as they populate from the lab. If you choose not to use this program; the labs have to be reviewed, copied & mailed causing a delay in getting the results to you.

## 2012-01-03 ENCOUNTER — Ambulatory Visit (AMBULATORY_SURGERY_CENTER): Payer: BC Managed Care – PPO | Admitting: Gastroenterology

## 2012-01-03 ENCOUNTER — Encounter: Payer: Self-pay | Admitting: Gastroenterology

## 2012-01-03 VITALS — BP 144/87 | HR 83 | Temp 98.3°F | Resp 20 | Ht 74.0 in | Wt 176.0 lb

## 2012-01-03 DIAGNOSIS — Z860100 Personal history of colon polyps, unspecified: Secondary | ICD-10-CM

## 2012-01-03 DIAGNOSIS — Z1211 Encounter for screening for malignant neoplasm of colon: Secondary | ICD-10-CM

## 2012-01-03 DIAGNOSIS — Z800 Family history of malignant neoplasm of digestive organs: Secondary | ICD-10-CM

## 2012-01-03 MED ORDER — SODIUM CHLORIDE 0.9 % IV SOLN: 500.0000 mL | INTRAVENOUS | Status: DC

## 2012-01-03 NOTE — Progress Notes (Signed)
Patient did not experience any of the following events: a burn prior to discharge; a fall within the facility; wrong site/side/patient/procedure/implant event; or a hospital transfer or hospital admission upon discharge from the facility. (G8907) Patient did not have preoperative order for IV antibiotic SSI prophylaxis. (G8918)  

## 2012-01-03 NOTE — Op Note (Signed)
[facility] [address] [place], [address]   COLONOSCOPY PROCEDURE REPORT  PATIENT: Mike, Stephens  MR#: [id number] BIRTHDATE: 06/12/1950 , 61  yrs. old GENDER: Male ENDOSCOPIST: Meryl Dare, MD, [facility]  PROCEDURE DATE:  01/03/2012 PROCEDURE:   Colonoscopy, screening ASA CLASS:   Class II INDICATIONS:patient's personal history of adenomatous colon polyps 01/2008 and last colonoscopy performed 4 years ago. MEDICATIONS: MAC sedation, administered by CRNA and propofol (Diprivan) 250mg  IV DESCRIPTION OF PROCEDURE:   After the risks benefits and alternatives of the procedure were thoroughly explained, informed consent was obtained.  A digital rectal exam revealed no abnormalities of the rectum.   The LB [id number]-[id number]  endoscope was introduced through the anus and advanced to the cecum, which was identified by both the appendix and ileocecal valve. No adverse events experienced.   The quality of the prep was excellent, using MoviPrep  The instrument was then slowly withdrawn as the colon was fully examined.   COLON FINDINGS: Mild diverticulosis was noted in the sigmoid colon. The colon was otherwise normal.  There was no diverticulosis, inflamation, polyps or cancers unless previously stated. Retroflexed views revealed small internal hemorrhoids. The time to cecum=3 minutes 11 seconds.  Withdrawal time=9 minutes 15 seconds. The scope was withdrawn and the procedure completed. COMPLICATIONS: There were no complications.  ENDOSCOPIC IMPRESSION: 1.   Mild diverticulosis was noted in the sigmoid colon 2.   Small internal hemorrhoidsl  RECOMMENDATIONS: 1.  High fiber diet with liberal fluid intake. 2.  Repeat Colonoscopy in 5 years.   eSigned:  Meryl Dare, MD, [facility] 01/03/2012 11:28 AM

## 2012-01-03 NOTE — Patient Instructions (Addendum)
Mild diverticulosis, small internal hemorrhoids High fiber diet recommended Repeat colonoscopy in 5 years  YOU HAD AN ENDOSCOPIC PROCEDURE TODAY AT THE [facility]: Refer to the procedure report that was given to you for any specific questions about what was found during the examination.  If the procedure report does not answer your questions, please call your gastroenterologist to clarify.  If you requested that your care partner not be given the details of your procedure findings, then the procedure report has been included in a sealed envelope for you to review at your convenience later.  YOU SHOULD EXPECT: Some feelings of bloating in the abdomen. Passage of more gas than usual.  Walking can help get rid of the air that was put into your GI tract during the procedure and reduce the bloating. If you had a lower endoscopy (such as a colonoscopy or flexible sigmoidoscopy) you may notice spotting of blood in your stool or on the toilet paper. If you underwent a bowel prep for your procedure, then you may not have a normal bowel movement for a few days.  DIET: Your first meal following the procedure should be a light meal and then it is ok to progress to your normal diet.  A half-sandwich or bowl of soup is an example of a good first meal.  Heavy or fried foods are harder to digest and may make you feel nauseous or bloated.  Likewise meals heavy in dairy and vegetables can cause extra gas to form and this can also increase the bloating.  Drink plenty of fluids but you should avoid alcoholic beverages for 24 hours.  ACTIVITY: Your care partner should take you home directly after the procedure.  You should plan to take it easy, moving slowly for the rest of the day.  You can resume normal activity the day after the procedure however you should NOT DRIVE or use heavy machinery for 24 hours (because of the sedation medicines used during the test).    SYMPTOMS TO REPORT IMMEDIATELY: A  gastroenterologist can be reached at any hour.  During normal business hours, 8:30 AM to 5:00 PM Monday through Friday, call [phone].  After hours and on weekends, please call the GI answering service at [phone] who will take a message and have the physician on call contact you.   Following lower endoscopy (colonoscopy or flexible sigmoidoscopy):  Excessive amounts of blood in the stool  Significant tenderness or worsening of abdominal pains  Swelling of the abdomen that is new, acute  Fever of 100F or higher  FOLLOW UP: If any biopsies were taken you will be contacted by phone or by letter within the next 1-3 weeks.  Call your gastroenterologist if you have not heard about the biopsies in 3 weeks.  Our staff will call the home number listed on your records the next business day following your procedure to check on you and address any questions or concerns that you may have at that time regarding the information given to you following your procedure. This is a courtesy call and so if there is no answer at the home number and we have not heard from you through the emergency physician on call, we will assume that you have returned to your regular daily activities without incident.  SIGNATURES/CONFIDENTIALITY: You and/or your care partner have signed paperwork which will be entered into your electronic medical record.  These signatures attest to the fact that that the information above on your After Visit  Summary has been reviewed and is understood.  Full responsibility of the confidentiality of this discharge information lies with you and/or your care-partner.  

## 2012-01-04 ENCOUNTER — Telehealth: Payer: Self-pay | Admitting: *Deleted

## 2012-01-04 NOTE — Telephone Encounter (Signed)
  Follow up Call-  Call back number 01/03/2012  Post procedure Call Back phone  # [phone]  Permission to leave phone message Yes     Patient questions:  Message left to call us if necessary.

## 2012-01-26 ENCOUNTER — Other Ambulatory Visit: Payer: Self-pay | Admitting: Internal Medicine

## 2012-01-28 ENCOUNTER — Other Ambulatory Visit: Payer: Self-pay | Admitting: Internal Medicine

## 2012-03-18 ENCOUNTER — Telehealth: Payer: Self-pay | Admitting: Internal Medicine

## 2012-03-18 DIAGNOSIS — G4730 Sleep apnea, unspecified: Secondary | ICD-10-CM

## 2012-03-18 NOTE — Telephone Encounter (Signed)
He needs to check; to my knowledge Dr. Earl Gala is an internist & does not do sleep studies. Dr. Marylou Flesher is a sleep specialist  as is Dr. Jetty Duhamel

## 2012-03-18 NOTE — Telephone Encounter (Signed)
Pt would like referral for another sleep study. He would like to go to Dr. Benjaman Kindler on [address] [phone].

## 2012-03-18 NOTE — Telephone Encounter (Signed)
Dr.Hopper please advise 

## 2012-03-19 NOTE — Telephone Encounter (Signed)
I left message on voicemail informing patient of Hopp's response. Patient to call to further discuss

## 2012-03-19 NOTE — Telephone Encounter (Signed)
Left message on voicemail for patient to return call, if I am unavailable patient to leave detailed message in rely to information that I provided for him (Dr.Hopper's response to sleep study request)

## 2012-03-20 NOTE — Telephone Encounter (Signed)
I recommend Dr. Vedia Pereyra in [place] or Dr Marylou Flesher; these are both are board certified in sleep medicine. Any doctor can order a sleep study but these would be the best candidates to interpret the results and provide the optimal intervention

## 2012-03-20 NOTE — Telephone Encounter (Signed)
Spoke with patient, patient states a while ago (? 2006) he seen Dr.Clance, patient questions if this would be an option for him, patient states when he googled Dr.Osborne it indicated he does sleep studies as well. Patient would like for Dr.Hopper to schedule him with a sleep specialist.

## 2012-03-20 NOTE — Telephone Encounter (Signed)
Discuss with patient, referral placed 

## 2012-04-10 ENCOUNTER — Institutional Professional Consult (permissible substitution): Payer: BC Managed Care – PPO | Admitting: Pulmonary Disease

## 2012-04-25 ENCOUNTER — Encounter: Payer: Self-pay | Admitting: Pulmonary Disease

## 2012-04-25 ENCOUNTER — Ambulatory Visit (INDEPENDENT_AMBULATORY_CARE_PROVIDER_SITE_OTHER): Payer: BC Managed Care – PPO | Admitting: Pulmonary Disease

## 2012-04-25 VITALS — BP 130/80 | HR 76 | Temp 98.4°F | Ht 74.0 in | Wt 184.0 lb

## 2012-04-25 DIAGNOSIS — G4733 Obstructive sleep apnea (adult) (pediatric): Secondary | ICD-10-CM

## 2012-04-25 NOTE — Patient Instructions (Addendum)
Check with your insurance company regarding cpap and dental appliance coverage. Consider impact on lifestyle of the 2 different treatments. Please call me once you have made your decision.

## 2012-04-25 NOTE — Assessment & Plan Note (Signed)
The patient has a history of moderate obstructive sleep apnea which he decided to not treat in 2006.  He now is having more restless sleep, frequent awakenings, and also having an impact on his daytime alertness and energy level.  The patient does not have significant weight to lose, and therefore I suspect the etiology for his sleep apnea is genetics and craniofacial structure.  Possible treatment options include upper airway surgery, dental appliance, as well as CPAP.  I do not think surgery is appropriate at this time.  I have gone over treatment with a dental appliance and also CPAP, and the patient will base his decision on insurance coverage and also an impact to his lifestyle.  He would like to take some time to think about this, and will get back with me in the next one to 2 weeks.

## 2012-04-25 NOTE — Progress Notes (Signed)
  Subjective:    Patient ID: Mike Stephens, male    DOB: 1950/08/25, 62 y.o.   MRN: [id number]  HPI The patient is a 62 year old male who I've been asked to see for management of obstructive sleep apnea.  He was diagnosed in 2006 with moderate OSA, with an AHI of 25 events per hour.  He decided not to treat his sleep apnea at that time, but comes in today with increasing symptoms.  He continues to snore loudly, has frequent awakenings at night, and is only rested in the mornings about 50% of the time.  He feels that he is not as alert during the day, with a decreased energy level.  He also comments on a foggy sensorium.  He notes that he can doze in the evening with quiet times.  The patient states that his weight is neutral from 2 years ago, and his Epworth score today is 8.  Sleep Questionnaire: What time do you typically go to bed?( Between what hours) 12am How long does it take you to fall asleep? 15 minutes How many times during the night do you wake up? 4 What time do you get out of bed to start your day? 0745 Do you drive or operate heavy machinery in your occupation? No How much has your weight changed (up or down) over the past two years? (In pounds) 0 oz (0 kg) Have you ever had a sleep study before? Yes If yes, location of study? [place] If yes, date of study? 2006 Do you currently use CPAP? No Do you wear oxygen at any time? No    Review of Systems  Constitutional: Negative for fever and unexpected weight change.  HENT: Positive for congestion ( sinus related for several years). Negative for ear pain, nosebleeds, sore throat, rhinorrhea, sneezing, trouble swallowing, dental problem, postnasal drip and sinus pressure.   Eyes: Negative for redness and itching.  Respiratory: Positive for cough (spots found on former cxr 5 years ago with Alwyn Ren --off and on cough). Negative for chest tightness, shortness of breath and wheezing.   Cardiovascular: Negative for palpitations and leg swelling.    Gastrointestinal: Negative for nausea and vomiting.  Genitourinary: Negative for dysuria.  Musculoskeletal: Negative for joint swelling.  Skin: Negative for rash.  Neurological: Negative for headaches.  Hematological: Does not bruise/bleed easily.  Psychiatric/Behavioral: Negative for dysphoric mood. The patient is not nervous/anxious.        Objective:   Physical Exam Constitutional:  Well developed, no acute distress  HENT:  Nares patent without discharge, but narrowed with septal deviation to the left.  Oropharynx without exudate, palate and uvula are moderately elongated.   Eyes:  Perrla, eomi, no scleral icterus  Neck:  No JVD, no TMG  Cardiovascular:  Normal rate, regular rhythm, no rubs or gallops.  No murmurs        Intact distal pulses  Pulmonary :  Normal breath sounds, no stridor or respiratory distress   No rales, rhonchi, or wheezing  Abdominal:  Soft, nondistended, bowel sounds present.  No tenderness noted.   Musculoskeletal:  No lower extremity edema noted.  Lymph Nodes:  No cervical lymphadenopathy noted  Skin:  No cyanosis noted  Neurologic:  Alert, appropriate, moves all 4 extremities without obvious deficit.         Assessment & Plan:

## 2012-05-09 ENCOUNTER — Telehealth: Payer: Self-pay | Admitting: Pulmonary Disease

## 2012-05-09 NOTE — Telephone Encounter (Signed)
Made in error. Mike Stephens  °

## 2012-06-14 ENCOUNTER — Telehealth: Payer: Self-pay | Admitting: Internal Medicine

## 2012-06-14 NOTE — Telephone Encounter (Signed)
[facility] and needs all immunizations faxed to stephanie at [phone].

## 2012-06-14 NOTE — Telephone Encounter (Signed)
Immunization record faxed

## 2012-06-17 ENCOUNTER — Telehealth: Payer: Self-pay | Admitting: *Deleted

## 2012-06-17 NOTE — Telephone Encounter (Signed)
Pt left VM that [facility] should be contacting [place] for some info. .Left message to call office

## 2012-06-18 NOTE — Telephone Encounter (Signed)
Updated immunization per paper chart and faxed to [facility] at .[phone]

## 2012-07-01 ENCOUNTER — Other Ambulatory Visit: Payer: Self-pay | Admitting: Internal Medicine

## 2012-07-30 ENCOUNTER — Telehealth: Payer: Self-pay | Admitting: Internal Medicine

## 2012-07-30 NOTE — Telephone Encounter (Signed)
Hopp please advise  

## 2012-07-30 NOTE — Telephone Encounter (Signed)
Patient states he will be traveling to [place] soon and would like Dr. Alwyn Ren to write a letter stating that he takes pravastatin, levothyroxine, and doxazosin regularly. Patient states the authorities in this area are very strict with medications and patient would like to have this documentation should he need it. Pt will come pick up. Call mobile #.

## 2012-07-30 NOTE — Telephone Encounter (Signed)
Copy of medication list printed with attached business card. Patient aware information available for pick-up tomorrow as early as 8:00 am

## 2012-07-30 NOTE — Telephone Encounter (Signed)
Print out copy of Med List

## 2012-08-03 ENCOUNTER — Other Ambulatory Visit: Payer: Self-pay | Admitting: Internal Medicine

## 2012-09-27 ENCOUNTER — Other Ambulatory Visit: Payer: Self-pay | Admitting: Pulmonary Disease

## 2012-09-27 ENCOUNTER — Telehealth: Payer: Self-pay | Admitting: Pulmonary Disease

## 2012-09-27 DIAGNOSIS — G4733 Obstructive sleep apnea (adult) (pediatric): Secondary | ICD-10-CM

## 2012-09-27 NOTE — Telephone Encounter (Signed)
Order sent for cpap.

## 2012-09-27 NOTE — Telephone Encounter (Signed)
Called and spoke with pt and he stated that he was seen in Jan 2014 by [facility].  He stated that he was having problems with insurance to cover the cpap.  Had to get the insurance issues cleared up.  This has been taken care of as of now.  He is now ready to be set up for CPAP.  Pt wanted to make sure that there will no problems with getting this set up for a CPAP and supplies.  Pt is up to use any home care company.  KC please advise thanks  No Known Allergies

## 2012-09-30 NOTE — Telephone Encounter (Signed)
Pt aware order sent Carron Curie, CMA

## 2012-10-17 ENCOUNTER — Telehealth: Payer: Self-pay | Admitting: Pulmonary Disease

## 2012-10-18 NOTE — Telephone Encounter (Signed)
Will forward to KC as FYI 

## 2012-10-18 NOTE — Telephone Encounter (Signed)
Noted  

## 2012-11-12 ENCOUNTER — Telehealth: Payer: Self-pay | Admitting: Pulmonary Disease

## 2012-11-12 NOTE — Telephone Encounter (Signed)
Called and spoke with patient and he stated that he would like D/C order sent to [facility] and requested to be set up with [facility]. Order to D/C cpap set up faxed to [facility] and staff message sent to [facility] that order is in for s9 escape auto with h/h at 10 cm., Mask of choice. Pt is aware of the process and to let us know if he has any other needs or requests. Rhonda J Cobb

## 2012-11-12 NOTE — Telephone Encounter (Signed)
Appointment with Dr. Shelle Iron originally scheduled for 12/06/12. Appointment has been r/s to allow for pt to be on cpap longer than 2 wks. Pt has been r/s to 12/27/12. Pt is aware of appointment date and time. Rhonda J Cobb

## 2012-11-15 ENCOUNTER — Ambulatory Visit: Payer: BC Managed Care – PPO | Admitting: Pulmonary Disease

## 2012-12-01 ENCOUNTER — Other Ambulatory Visit: Payer: Self-pay | Admitting: Internal Medicine

## 2012-12-06 ENCOUNTER — Ambulatory Visit: Payer: BC Managed Care – PPO | Admitting: Pulmonary Disease

## 2012-12-27 ENCOUNTER — Encounter: Payer: Self-pay | Admitting: Internal Medicine

## 2012-12-27 ENCOUNTER — Ambulatory Visit (INDEPENDENT_AMBULATORY_CARE_PROVIDER_SITE_OTHER): Payer: BC Managed Care – PPO | Admitting: Internal Medicine

## 2012-12-27 VITALS — BP 138/69 | HR 72 | Temp 98.1°F | Ht 74.75 in | Wt 190.6 lb

## 2012-12-27 DIAGNOSIS — Z0000 Encounter for general adult medical examination without abnormal findings: Secondary | ICD-10-CM

## 2012-12-27 DIAGNOSIS — G4733 Obstructive sleep apnea (adult) (pediatric): Secondary | ICD-10-CM

## 2012-12-27 DIAGNOSIS — Z86010 Personal history of colonic polyps: Secondary | ICD-10-CM

## 2012-12-27 LAB — CBC WITH DIFFERENTIAL/PLATELET
Basophils Relative: 0.6 % (ref 0.0–3.0)
Eosinophils Relative: 1.8 % (ref 0.0–5.0)
Lymphocytes Relative: 24.6 % (ref 12.0–46.0)
MCV: 97.6 fl (ref 78.0–100.0)
Monocytes Absolute: 0.7 10*3/uL (ref 0.1–1.0)
Neutrophils Relative %: 59.9 % (ref 43.0–77.0)
RBC: 4.39 Mil/uL (ref 4.22–5.81)
WBC: 5 10*3/uL (ref 4.5–10.5)

## 2012-12-27 LAB — TSH: TSH: 3.85 u[IU]/mL (ref 0.35–5.50)

## 2012-12-27 LAB — HEPATIC FUNCTION PANEL
Albumin: 4.4 g/dL (ref 3.5–5.2)
Alkaline Phosphatase: 57 U/L (ref 39–117)
Total Protein: 7.2 g/dL (ref 6.0–8.3)

## 2012-12-27 LAB — BASIC METABOLIC PANEL
CO2: 30 mEq/L (ref 19–32)
Calcium: 9.1 mg/dL (ref 8.4–10.5)
Creatinine, Ser: 0.9 mg/dL (ref 0.4–1.5)
Glucose, Bld: 102 mg/dL — ABNORMAL HIGH (ref 70–99)

## 2012-12-27 LAB — LIPID PANEL
HDL: 54.8 mg/dL (ref >39.00)
Triglycerides: 53 mg/dL (ref 0.0–149.0)

## 2012-12-27 MED ORDER — ATORVASTATIN CALCIUM 20 MG PO TABS: 20.0000 mg | ORAL_TABLET | Freq: Every day | ORAL | Status: DC

## 2012-12-27 NOTE — Patient Instructions (Addendum)
If you activate the  My Chart system; lab & Xray results will be released directly  to you as soon as I review & address these through the computer. As per [place] all records must be reviewed and signed off within 72 hours; but I attempt to complete this within 36 hours unless I have no access to the electronic medical record.If I wait more than 36 hours the volume of reports becomes difficult to manage optimally. If you choose not to sign up for My Chart within 36 hours of labs being drawn; results will be reviewed & interpretation added before being copied & mailed, causing a delay in getting the results to you.If you do not receive that report within 7-10 days ,please call. Additionally you can use this system to gain direct  access to your records  if  out of town or @ an office of a  physician who is not in  the My Chart network.  This improves continuity of care & places you in control of your medical record. Take the EKG to any emergency room or preop visits. There are nonspecific changes; as long as there is no new change these are not clinically significant . If the old EKG is not available for comparison; it may result in unnecessary hospitalization for observation with significant unnecessary expense. Please  schedule fasting Labs in 10 weeks after statin changes: CK,Lipids, hepatic panel. PLEASE BRING THESE INSTRUCTIONS TO FOLLOW UP  LAB APPOINTMENT.This will guarantee correct labs are drawn, eliminating need for repeat blood sampling ( needle sticks ! ). Diagnoses /Codes:272.4,995.20.

## 2012-12-27 NOTE — Progress Notes (Signed)
  Subjective:    Patient ID: Mike Stephens, male    DOB: [birth year], 62 y.o.   MRN: [id number]  HPI  Mr Kalisz is here for a physical;acute issues denied.     Review of Systems He is on a heart healthy diet; he exercises >60 minutes 3-4 times per week without symptoms. Specifically he denies chest pain, palpitations, dyspnea, or claudication. Family history is negative for premature coronary disease. Advanced cholesterol testing reveals his LDL goal was less than 110; ideally < 85.  He describes intermittent arthralgias involving the lower back, hip, and gums. He has noted that decreased temperature and dampness may be triggers.     Objective:   Physical Exam  Gen.:Thin but healthy and well-nourished in appearance. Alert, appropriate and cooperative throughout exam. Head: Normocephalic without obvious abnormalities;pattern alopecia. Beard & moustache Eyes: No corneal or conjunctival inflammation noted. Pupils equal round reactive to light and accommodation. Extraocular motion intact. Vision grossly normal with lenses. Ears: External  ear exam reveals no significant lesions or deformities. TMs scarred. Hearing is grossly decreased on L. Nose: External nasal exam reveals no deformity or inflammation. Nasal mucosa are pink and moist. No lesions or exudates noted.   Mouth: Oral mucosa and oropharynx reveal no lesions or exudates. Teeth in good repair. Neck: No deformities, masses, or tenderness noted. Range of motion & Thyroid normal. Lungs: Normal respiratory effort; chest expands symmetrically. Lungs are clear to auscultation without rales, wheezes, or increased work of breathing. Heart: Normal rate and rhythm. Normal S1 and S2. No gallop, click, or rub. Grade 1/2-1 over 6 systolic murmur. Abdomen: Bowel sounds normal; abdomen soft and nontender. No masses, organomegaly or hernias noted. 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 DRE reveals normal left lobe. Right  lobe is upper limits of normal with slight induration. No nodules present. . Musculoskeletal/extremities: No deformity or scoliosis noted of  the thoracic or lumbar spine. No clubbing, cyanosis, edema, or deformity noted. Range of motion normal .Tone & strength  normal.Joints normal. Nail health normal. Vascular: Carotid, radial artery, dorsalis pedis and dorsalis posterior tibial pulses are full and equal. No bruits present. Neurologic: Alert and oriented x3. Deep tendon reflexes symmetrical and normal.  Skin: Intact without suspicious lesions or rashes.  Lymph: No cervical, axillary, or inguinal lymphadenopathy present.  Psych: Mood and affect are normal; no evidence of anxiety or depression.          Assessment & Plan:  #1 comprehensive physical exam; no acute findings  Plan: see Orders  & Recommendations

## 2012-12-30 ENCOUNTER — Ambulatory Visit: Payer: BC Managed Care – PPO

## 2012-12-30 DIAGNOSIS — R7309 Other abnormal glucose: Secondary | ICD-10-CM

## 2012-12-31 LAB — HEMOGLOBIN A1C: Hgb A1c MFr Bld: 5.8 % (ref 4.6–6.5)

## 2013-01-03 ENCOUNTER — Ambulatory Visit (INDEPENDENT_AMBULATORY_CARE_PROVIDER_SITE_OTHER): Payer: BC Managed Care – PPO | Admitting: Pulmonary Disease

## 2013-01-03 ENCOUNTER — Encounter: Payer: Self-pay | Admitting: Pulmonary Disease

## 2013-01-03 VITALS — BP 120/70 | HR 64 | Temp 97.7°F | Ht 74.5 in | Wt 193.8 lb

## 2013-01-03 DIAGNOSIS — G4733 Obstructive sleep apnea (adult) (pediatric): Secondary | ICD-10-CM

## 2013-01-03 NOTE — Progress Notes (Signed)
  Subjective:    Patient ID: Mike Stephens, male    DOB: Feb 03, 1951, 62 y.o.   MRN: [id number]  HPI Patient comes in today for followup of his obstructive sleep apnea.  He is wearing CPAP compliantly for his download, and has excellent control of his AHI.  He was having some issues with mask leak, but has changed to a different mask and is doing much better.  He thinks he is sleeping better with the device, with improved daytime alertness.   Review of Systems  Constitutional: Negative for fever and unexpected weight change.  HENT: Negative for ear pain, nosebleeds, congestion, sore throat, rhinorrhea, sneezing, trouble swallowing, dental problem, postnasal drip and sinus pressure.   Eyes: Negative for redness and itching.  Respiratory: Negative for cough, chest tightness, shortness of breath and wheezing.   Cardiovascular: Negative for palpitations and leg swelling.  Gastrointestinal: Negative for nausea and vomiting.  Genitourinary: Negative for dysuria.  Musculoskeletal: Negative for joint swelling.  Skin: Negative for rash.  Neurological: Negative for headaches.  Hematological: Does not bruise/bleed easily.  Psychiatric/Behavioral: Negative for dysphoric mood. The patient is not nervous/anxious.        Objective:   Physical Exam Well-developed male in no acute distress Nose without purulence or discharge noted No skin breakdown or pressure necrosis from the CPAP mask Neck without lymphadenopathy or thyromegaly Lower extremities without edema, no cyanosis Alert, does not appear to be sleepy, moves all 4 extremities.       Assessment & Plan:

## 2013-01-03 NOTE — Patient Instructions (Addendum)
Will leave pressure on 10cm since the download shows adequate control.  Let me know if you would like to consider the auto for a trial period. followup with me in 6mos if doing well.

## 2013-01-03 NOTE — Assessment & Plan Note (Signed)
The patient is doing well on CPAP currently, and thinks that he is sleeping better with increased daytime alertness.  He is having new issue with his pressure, and has changed to a different mask in order to minimize leaks.  I've asked him to continue with CPAP, and to follow up again with me in 6 months.

## 2013-01-09 ENCOUNTER — Telehealth: Payer: Self-pay | Admitting: Internal Medicine

## 2013-01-09 NOTE — Telephone Encounter (Signed)
Patient says he requested to switch from Pravastatin to Lipitor due to the cost of the drug, however, he has decided that he wants to research the Lipitor drug more before trying them out. States that he receive a printed rx for Lipitor which he has not yet filled. He is requesting a refill on his Pravastatin rx to be sent to [facility] pharmacy. Please advise.

## 2013-01-10 ENCOUNTER — Other Ambulatory Visit: Payer: Self-pay | Admitting: *Deleted

## 2013-01-10 DIAGNOSIS — E785 Hyperlipidemia, unspecified: Secondary | ICD-10-CM

## 2013-01-10 MED ORDER — PRAVASTATIN SODIUM 40 MG PO TABS: 40.0000 mg | ORAL_TABLET | Freq: Every day | ORAL | Status: DC

## 2013-01-10 NOTE — Telephone Encounter (Signed)
Refill for pravastatin sent to [facility] on [address]

## 2013-01-28 ENCOUNTER — Other Ambulatory Visit: Payer: Self-pay | Admitting: Internal Medicine

## 2013-01-28 NOTE — Telephone Encounter (Signed)
Levothyroxine refill sent to pharmacy 

## 2013-02-11 ENCOUNTER — Telehealth: Payer: Self-pay | Admitting: Internal Medicine

## 2013-02-11 ENCOUNTER — Other Ambulatory Visit: Payer: Self-pay | Admitting: *Deleted

## 2013-02-11 DIAGNOSIS — E785 Hyperlipidemia, unspecified: Secondary | ICD-10-CM

## 2013-02-11 MED ORDER — PRAVASTATIN SODIUM 40 MG PO TABS: 40.0000 mg | ORAL_TABLET | Freq: Every day | ORAL | Status: DC

## 2013-02-11 NOTE — Telephone Encounter (Addendum)
Patient would like a refill for pravastatin (PRAVACHOL) 40 MG tablet and also if possible a 90 day suppy   Pharmacy [facility] [address]

## 2013-02-11 NOTE — Telephone Encounter (Signed)
[facility]

## 2013-02-21 ENCOUNTER — Telehealth: Payer: Self-pay | Admitting: Internal Medicine

## 2013-02-21 NOTE — Telephone Encounter (Signed)
Patient called and stated that dr hopper referred him to go to [facility] [place] to get a shot because he was traveling. He went there and received service but received a bill in the mail that stated he was out of network. So he was responsible for a high bill. Patient just wanted to know why did dr hopper send him there if they were out of network with his insurance.

## 2013-02-27 ENCOUNTER — Other Ambulatory Visit: Payer: Self-pay

## 2013-02-28 ENCOUNTER — Other Ambulatory Visit: Payer: Self-pay | Admitting: *Deleted

## 2013-02-28 ENCOUNTER — Telehealth: Payer: Self-pay | Admitting: Internal Medicine

## 2013-02-28 MED ORDER — DOXAZOSIN MESYLATE 8 MG PO TABS: ORAL_TABLET | ORAL | Status: DC

## 2013-02-28 NOTE — Telephone Encounter (Signed)
Done

## 2013-02-28 NOTE — Telephone Encounter (Signed)
Doxazosin refill sent to pharmacy

## 2013-02-28 NOTE — Telephone Encounter (Signed)
Patient called and requested a refill for doxazosin (CARDURA) 8 MG tablet but for a 90 day supply.   Pharmacy [facility] [address]

## 2013-03-07 NOTE — Telephone Encounter (Signed)
Patient states that he was only rx'd 30 pills which costs him the same amount as 90 pills. Please advise if rx for #90 can be sent. Patient wants a call back.

## 2013-03-10 ENCOUNTER — Other Ambulatory Visit: Payer: Self-pay | Admitting: *Deleted

## 2013-03-10 MED ORDER — DOXAZOSIN MESYLATE 8 MG PO TABS: ORAL_TABLET | ORAL | Status: AC

## 2013-03-10 NOTE — Telephone Encounter (Signed)
Done

## 2013-03-26 ENCOUNTER — Other Ambulatory Visit: Payer: BC Managed Care – PPO

## 2013-04-22 ENCOUNTER — Other Ambulatory Visit: Payer: Self-pay | Admitting: Internal Medicine

## 2013-04-22 NOTE — Telephone Encounter (Signed)
Levothyroxine refilled per protocol. JG//CMA 

## 2013-07-04 ENCOUNTER — Encounter: Payer: Self-pay | Admitting: Pulmonary Disease

## 2013-07-04 ENCOUNTER — Ambulatory Visit (INDEPENDENT_AMBULATORY_CARE_PROVIDER_SITE_OTHER): Payer: BC Managed Care – PPO | Admitting: Pulmonary Disease

## 2013-07-04 VITALS — BP 130/70 | HR 69 | Temp 97.8°F | Ht 74.0 in | Wt 190.0 lb

## 2013-07-04 DIAGNOSIS — G4733 Obstructive sleep apnea (adult) (pediatric): Secondary | ICD-10-CM

## 2013-07-04 NOTE — Patient Instructions (Signed)
Continue with cpap, and keep up with mask cushion changes and supplies. followup with me in one year if doing well, but call if having tolerance issues.

## 2013-07-04 NOTE — Progress Notes (Signed)
   Subjective:    Patient ID: Mike Stephens, male    DOB: 1950/11/02, 63 y.o.   MRN: [id number]  HPI Patient comes in today for followup of his obstructive sleep apnea. He is wearing CPAP compliantly at his optimal pressure by his download, and feels that it definitely helps his sleep and daytime alertness. He is having no significant issues with his device, but is having a little bit of mask leak. Overall, he is satisfied with his progress.   Review of Systems  Constitutional: Negative for fever and unexpected weight change.  HENT: Negative for congestion, dental problem, ear pain, nosebleeds, postnasal drip, rhinorrhea, sinus pressure, sneezing, sore throat and trouble swallowing.   Eyes: Negative for redness and itching.  Respiratory: Negative for cough, chest tightness, shortness of breath and wheezing.   Cardiovascular: Negative for palpitations and leg swelling.  Gastrointestinal: Negative for nausea and vomiting.  Genitourinary: Negative for dysuria.  Musculoskeletal: Negative for joint swelling.  Skin: Negative for rash.  Neurological: Negative for headaches.  Hematological: Does not bruise/bleed easily.  Psychiatric/Behavioral: Negative for dysphoric mood. The patient is not nervous/anxious.        Objective:   Physical Exam Well-developed male in no acute distress Nose without purulence or discharge noted No skin breakdown or pressure necrosis from the CPAP mask Neck without lymphadenopathy or thyromegaly Lower extremities without edema, no cyanosis Alert and oriented, moves all 4 extremities.       Assessment & Plan:

## 2013-07-04 NOTE — Assessment & Plan Note (Signed)
The patient is been wearing CPAP compliantly, and he feels that it has greatly helped his sleep and daytime alertness. He is having no significant issues with the device, but is having a little bit of mask leaking because he is trying to extend the life of his cushions. I've asked him to try and replace this consistently every 2-3 months.

## 2013-08-07 ENCOUNTER — Ambulatory Visit (INDEPENDENT_AMBULATORY_CARE_PROVIDER_SITE_OTHER): Payer: BC Managed Care – PPO | Admitting: Surgery

## 2013-09-29 ENCOUNTER — Other Ambulatory Visit: Payer: Self-pay

## 2013-09-29 MED ORDER — LEVOTHYROXINE SODIUM 25 MCG PO TABS: ORAL_TABLET | ORAL | Status: DC

## 2013-12-30 ENCOUNTER — Encounter: Payer: BC Managed Care – PPO | Admitting: Internal Medicine

## 2014-01-22 ENCOUNTER — Encounter: Payer: Self-pay | Admitting: Gastroenterology

## 2014-03-20 ENCOUNTER — Other Ambulatory Visit: Payer: Self-pay | Admitting: Internal Medicine

## 2014-07-08 ENCOUNTER — Ambulatory Visit: Payer: BC Managed Care – PPO | Admitting: Pulmonary Disease

## 2015-08-10 ENCOUNTER — Other Ambulatory Visit: Payer: Self-pay | Admitting: Otolaryngology

## 2015-08-10 DIAGNOSIS — H95112 Chronic inflammation of postmastoidectomy cavity, left ear: Secondary | ICD-10-CM

## 2015-08-10 DIAGNOSIS — H906 Mixed conductive and sensorineural hearing loss, bilateral: Secondary | ICD-10-CM

## 2015-08-10 DIAGNOSIS — H7411 Adhesive right middle ear disease: Secondary | ICD-10-CM

## 2015-08-10 DIAGNOSIS — G44221 Chronic tension-type headache, intractable: Secondary | ICD-10-CM

## 2015-08-17 ENCOUNTER — Ambulatory Visit
Admission: RE | Admit: 2015-08-17 | Discharge: 2015-08-17 | Disposition: A | Discharge status: Home / Self Care | Payer: BLUE CROSS/BLUE SHIELD | Source: Ambulatory Visit | Attending: Otolaryngology | Admitting: Otolaryngology

## 2015-08-17 DIAGNOSIS — H7411 Adhesive right middle ear disease: Secondary | ICD-10-CM

## 2015-08-17 DIAGNOSIS — G44221 Chronic tension-type headache, intractable: Secondary | ICD-10-CM

## 2015-08-17 DIAGNOSIS — H95112 Chronic inflammation of postmastoidectomy cavity, left ear: Secondary | ICD-10-CM

## 2015-08-17 DIAGNOSIS — H906 Mixed conductive and sensorineural hearing loss, bilateral: Secondary | ICD-10-CM

## 2016-02-03 ENCOUNTER — Telehealth: Payer: Self-pay | Admitting: Gastroenterology

## 2016-02-03 NOTE — Telephone Encounter (Signed)
OK to do his colonoscopy in early 2018.

## 2016-02-03 NOTE — Telephone Encounter (Signed)
Attempted to return call.  No voicemail or answer

## 2016-02-04 NOTE — Telephone Encounter (Signed)
Wife notified.  He and or his wife will call back in November to schedule for early 2018

## 2016-02-16 ENCOUNTER — Other Ambulatory Visit: Payer: Self-pay | Admitting: Dermatology

## 2016-03-07 ENCOUNTER — Encounter: Payer: Self-pay | Admitting: Gastroenterology

## 2016-03-21 ENCOUNTER — Other Ambulatory Visit: Payer: Self-pay | Admitting: Otolaryngology

## 2016-04-27 ENCOUNTER — Telehealth: Payer: Self-pay | Admitting: *Deleted

## 2016-04-27 ENCOUNTER — Ambulatory Visit (AMBULATORY_SURGERY_CENTER): Payer: Self-pay | Admitting: *Deleted

## 2016-04-27 VITALS — Ht 74.0 in | Wt 182.2 lb

## 2016-04-27 DIAGNOSIS — Z86010 Personal history of colonic polyps: Secondary | ICD-10-CM

## 2016-04-27 MED ORDER — NA SULFATE-K SULFATE-MG SULF 17.5-3.13-1.6 GM/177ML PO SOLN: ORAL | 0 refills | Status: DC

## 2016-04-27 NOTE — Telephone Encounter (Signed)
Vinnie Level and Dr. Fuller Plan,  This pt is cleared for anesthetic care at [facility].  Thanks,  Osvaldo Angst

## 2016-04-27 NOTE — Telephone Encounter (Signed)
Dr Fuller Plan and Jenny Reichmann,   Patient's father died due to "a kink in his throat."  Patient was told that he has the same problem as his Dad, and that the doctor should be aware.  He states that he has been done here at [facility] in 2013.  He had MAC without incident.  Dr. Fuller Plan, If Jenny Reichmann states that this patient is not proper to do at [facility], do you want an OV or direct to the hospital?  Vinnie Level

## 2016-04-27 NOTE — Progress Notes (Signed)
Not allergic to soy or eggs. No diet medications. Patient's father died due to "a kink in his throat."  Patient was told that he has the same problem as his Dad, and that the doctor should be aware.  He states that he has been done here at [facility] in 2013.  He had MAC without incident.

## 2016-04-27 NOTE — Telephone Encounter (Signed)
I don't know what is meant by a "kink in his throat". I reviewed his history and can't find anything concerning for MAC use in [place] from an airway or cardiopulmonary standpoint. He had 2 prior colonoscopies with MAC in [place] in 2013 and moderate sedation in 2009 without problems. I think this should be ok for [facility]. Await John Nulty's review.

## 2016-05-03 ENCOUNTER — Encounter: Payer: Self-pay | Admitting: Gastroenterology

## 2016-05-09 ENCOUNTER — Telehealth: Payer: Self-pay | Admitting: Gastroenterology

## 2016-05-09 NOTE — Telephone Encounter (Signed)
Returned phone call to patient, pt has eaten beans and black eyed peas the last several days. Advised to not eat beans any further and drink lots of liquids today and follow through with prep as ordered. Pt also inquiring of weather as he would like to eat if we are going to be closed. Advised I would call him back when a decision has been formally made.

## 2016-05-10 ENCOUNTER — Ambulatory Visit (AMBULATORY_SURGERY_CENTER): Payer: BLUE CROSS/BLUE SHIELD | Admitting: Gastroenterology

## 2016-05-10 ENCOUNTER — Encounter: Payer: Self-pay | Admitting: Gastroenterology

## 2016-05-10 VITALS — BP 105/64 | HR 60 | Temp 96.6°F | Resp 18 | Ht 74.0 in | Wt 182.0 lb

## 2016-05-10 DIAGNOSIS — Z86010 Personal history of colonic polyps: Secondary | ICD-10-CM | POA: Diagnosis present

## 2016-05-10 DIAGNOSIS — D123 Benign neoplasm of transverse colon: Secondary | ICD-10-CM

## 2016-05-10 DIAGNOSIS — D120 Benign neoplasm of cecum: Secondary | ICD-10-CM

## 2016-05-10 MED ORDER — SODIUM CHLORIDE 0.9 % IV SOLN: 500.0000 mL | INTRAVENOUS | Status: DC

## 2016-05-10 NOTE — Progress Notes (Signed)
Report to PACU, RN, vss, BBS= Clear.  

## 2016-05-10 NOTE — Progress Notes (Signed)
Called to room to assist during endoscopic procedure.  Patient ID and intended procedure confirmed with present staff. Received instructions for my participation in the procedure from the performing physician.  

## 2016-05-10 NOTE — Patient Instructions (Signed)
Impression/Recommendations:  Polyp handout given to patient. Hemorrhoid handout given to patient.  Diverticulosis handout given to patient.  Repeat colonoscopy in 5 years for surveillance.  YOU HAD AN ENDOSCOPIC PROCEDURE TODAY AT [facility]:   Refer to the procedure report that was given to you for any specific questions about what was found during the examination.  If the procedure report does not answer your questions, please call your gastroenterologist to clarify.  If you requested that your care partner not be given the details of your procedure findings, then the procedure report has been included in a sealed envelope for you to review at your convenience later.  YOU SHOULD EXPECT: Some feelings of bloating in the abdomen. Passage of more gas than usual.  Walking can help get rid of the air that was put into your GI tract during the procedure and reduce the bloating. If you had a lower endoscopy (such as a colonoscopy or flexible sigmoidoscopy) you may notice spotting of blood in your stool or on the toilet paper. If you underwent a bowel prep for your procedure, you may not have a normal bowel movement for a few days.  Please Note:  You might notice some irritation and congestion in your nose or some drainage.  This is from the oxygen used during your procedure.  There is no need for concern and it should clear up in a day or so.  SYMPTOMS TO REPORT IMMEDIATELY:   Following lower endoscopy (colonoscopy or flexible sigmoidoscopy):  Excessive amounts of blood in the stool  Significant tenderness or worsening of abdominal pains  Swelling of the abdomen that is new, acute  Fever of 100F or higher   For urgent or emergent issues, a gastroenterologist can be reached at any hour by calling [phone].   DIET:  We do recommend a small meal at first, but then you may proceed to your regular diet.  Drink plenty of fluids but you should avoid alcoholic beverages for 24  hours.  ACTIVITY:  You should plan to take it easy for the rest of today and you should NOT DRIVE or use heavy machinery until tomorrow (because of the sedation medicines used during the test).    FOLLOW UP: Our staff will call the number listed on your records the next business day following your procedure to check on you and address any questions or concerns that you may have regarding the information given to you following your procedure. If we do not reach you, we will leave a message.  However, if you are feeling well and you are not experiencing any problems, there is no need to return our call.  We will assume that you have returned to your regular daily activities without incident.  If any biopsies were taken you will be contacted by phone or by letter within the next 1-3 weeks.  Please call us at [phone] if you have not heard about the biopsies in 3 weeks.    SIGNATURES/CONFIDENTIALITY: You and/or your care partner have signed paperwork which will be entered into your electronic medical record.  These signatures attest to the fact that that the information above on your After Visit Summary has been reviewed and is understood.  Full responsibility of the confidentiality of this discharge information lies with you and/or your care-partner.

## 2016-05-10 NOTE — Op Note (Signed)
[name] Patient Name: Mike Stephens Procedure Date: 05/10/2016 10:19 AM MRN: [id number] Endoscopist: Ladene Artist , MD Age: 66 Referring MD:  Date of Birth: 26-Apr-1950 Gender: Male Account #: [id number] Procedure:                Colonoscopy Indications:              Surveillance: Personal history of adenomatous                            polyps on last colonoscopy > 5 years ago Medicines:                Monitored Anesthesia Care Procedure:                Pre-Anesthesia Assessment:                           - Prior to the procedure, a History and Physical                            was performed, and patient medications and                            allergies were reviewed. The patient's tolerance of                            previous anesthesia was also reviewed. The risks                            and benefits of the procedure and the sedation                            options and risks were discussed with the patient.                            All questions were answered, and informed consent                            was obtained. Prior Anticoagulants: The patient has                            taken no previous anticoagulant or antiplatelet                            agents. ASA Grade Assessment: II - A patient with                            mild systemic disease. After reviewing the risks                            and benefits, the patient was deemed in                            satisfactory condition to undergo the procedure.  After obtaining informed consent, the colonoscope                            was passed under direct vision. Throughout the                            procedure, the patient's blood pressure, pulse, and                            oxygen saturations were monitored continuously. The                            Model PCF-H190L [id number] scope was introduced                            through the anus and  advanced to the the cecum,                            identified by appendiceal orifice and ileocecal                            valve. The ileocecal valve, appendiceal orifice,                            and rectum were photographed. The quality of the                            bowel preparation was good. The colonoscopy was                            performed without difficulty. The patient tolerated                            the procedure well. Scope In: 10:26:17 AM Scope Out: 10:40:27 AM Scope Withdrawal Time: 0 hours 11 minutes 4 seconds  Total Procedure Duration: 0 hours 14 minutes 10 seconds  Findings:                 The perianal and digital rectal examinations were                            normal.                           Three sessile polyps were found in the transverse                            colon and cecum. The polyps were 5 to 6 mm in size.                            These polyps were removed with a cold snare.                            Resection and retrieval were complete.  Internal hemorrhoids were found during                            retroflexion. The hemorrhoids were small and Grade                            I (internal hemorrhoids that do not prolapse).                           Many small-mouthed diverticula were found in the                            sigmoid colon and descending colon.                           The exam was otherwise without abnormality on                            direct and retroflexion views. Complications:            No immediate complications. Estimated blood loss:                            None. Estimated Blood Loss:     Estimated blood loss: none. Impression:               - Three 5 to 6 mm polyps in the transverse colon                            and in the cecum, removed with a cold snare.                            Resected and retrieved.                           - Internal hemorrhoids.                            - Diverticulosis in the sigmoid colon and in the                            descending colon.                           - The examination was otherwise normal on direct                            and retroflexion views. Recommendation:           - Repeat colonoscopy in 5 years for surveillance.                           - Patient has a contact number available for                            emergencies. The signs and symptoms of potential  delayed complications were discussed with the                            patient. Return to normal activities tomorrow.                            Written discharge instructions were provided to the                            patient.                           - Resume previous diet.                           - Continue present medications.                           - Await pathology results. Ladene Artist, MD 05/10/2016 10:44:09 AM This report has been signed electronically.

## 2016-05-12 ENCOUNTER — Telehealth: Payer: Self-pay | Admitting: *Deleted

## 2016-05-12 NOTE — Telephone Encounter (Signed)
Message left

## 2016-05-15 ENCOUNTER — Telehealth: Payer: Self-pay

## 2016-05-15 NOTE — Telephone Encounter (Signed)
  Follow up Call-  Call back number 05/10/2016  Post procedure Call Back phone  # [phone]  Permission to leave phone message Yes  Some recent data might be hidden     Patient questions:  Do you have a fever, pain , or abdominal swelling? No. Pain Score  0 *  Have you tolerated food without any problems? Yes.    Have you been able to return to your normal activities? Yes.    Do you have any questions about your discharge instructions: Diet   No. Medications  No. Follow up visit  No.  Do you have questions or concerns about your Care? No.  Actions: * If pain score is 4 or above: No action needed, pain <4.

## 2016-05-18 ENCOUNTER — Encounter: Payer: Self-pay | Admitting: Gastroenterology

## 2017-05-08 ENCOUNTER — Other Ambulatory Visit: Payer: Self-pay | Admitting: Dermatology

## 2017-08-16 DIAGNOSIS — S0100XS Unspecified open wound of scalp, sequela: Secondary | ICD-10-CM | POA: Diagnosis not present

## 2017-08-16 DIAGNOSIS — H906 Mixed conductive and sensorineural hearing loss, bilateral: Secondary | ICD-10-CM | POA: Diagnosis not present

## 2017-08-16 DIAGNOSIS — H7201 Central perforation of tympanic membrane, right ear: Secondary | ICD-10-CM | POA: Diagnosis not present

## 2017-08-16 DIAGNOSIS — H7411 Adhesive right middle ear disease: Secondary | ICD-10-CM | POA: Diagnosis not present

## 2017-09-03 DIAGNOSIS — H906 Mixed conductive and sensorineural hearing loss, bilateral: Secondary | ICD-10-CM | POA: Diagnosis not present

## 2017-09-03 DIAGNOSIS — S0100XS Unspecified open wound of scalp, sequela: Secondary | ICD-10-CM | POA: Diagnosis not present

## 2017-09-03 DIAGNOSIS — H7101 Cholesteatoma of attic, right ear: Secondary | ICD-10-CM | POA: Diagnosis not present

## 2017-09-03 DIAGNOSIS — H7201 Central perforation of tympanic membrane, right ear: Secondary | ICD-10-CM | POA: Diagnosis not present

## 2017-09-03 DIAGNOSIS — H7411 Adhesive right middle ear disease: Secondary | ICD-10-CM | POA: Diagnosis not present

## 2017-09-03 DIAGNOSIS — H7121 Cholesteatoma of mastoid, right ear: Secondary | ICD-10-CM | POA: Diagnosis not present

## 2017-10-02 DIAGNOSIS — H7411 Adhesive right middle ear disease: Secondary | ICD-10-CM | POA: Diagnosis not present

## 2017-10-02 DIAGNOSIS — H906 Mixed conductive and sensorineural hearing loss, bilateral: Secondary | ICD-10-CM | POA: Diagnosis not present

## 2017-10-02 DIAGNOSIS — H7203 Central perforation of tympanic membrane, bilateral: Secondary | ICD-10-CM | POA: Diagnosis not present

## 2017-11-15 DIAGNOSIS — H906 Mixed conductive and sensorineural hearing loss, bilateral: Secondary | ICD-10-CM | POA: Diagnosis not present

## 2017-11-15 DIAGNOSIS — H7411 Adhesive right middle ear disease: Secondary | ICD-10-CM | POA: Diagnosis not present

## 2017-11-15 DIAGNOSIS — H7203 Central perforation of tympanic membrane, bilateral: Secondary | ICD-10-CM | POA: Diagnosis not present

## 2017-11-29 DIAGNOSIS — H1859 Other hereditary corneal dystrophies: Secondary | ICD-10-CM | POA: Diagnosis not present

## 2017-11-29 DIAGNOSIS — H52221 Regular astigmatism, right eye: Secondary | ICD-10-CM | POA: Diagnosis not present

## 2017-11-29 DIAGNOSIS — H2513 Age-related nuclear cataract, bilateral: Secondary | ICD-10-CM | POA: Diagnosis not present

## 2017-11-29 DIAGNOSIS — H5203 Hypermetropia, bilateral: Secondary | ICD-10-CM | POA: Diagnosis not present

## 2018-02-11 DIAGNOSIS — H7411 Adhesive right middle ear disease: Secondary | ICD-10-CM | POA: Diagnosis not present

## 2018-02-11 DIAGNOSIS — H906 Mixed conductive and sensorineural hearing loss, bilateral: Secondary | ICD-10-CM | POA: Diagnosis not present

## 2018-02-11 DIAGNOSIS — H7203 Central perforation of tympanic membrane, bilateral: Secondary | ICD-10-CM | POA: Diagnosis not present

## 2018-03-20 DIAGNOSIS — Z125 Encounter for screening for malignant neoplasm of prostate: Secondary | ICD-10-CM | POA: Diagnosis not present

## 2018-03-20 DIAGNOSIS — E038 Other specified hypothyroidism: Secondary | ICD-10-CM | POA: Diagnosis not present

## 2018-03-20 DIAGNOSIS — E7849 Other hyperlipidemia: Secondary | ICD-10-CM | POA: Diagnosis not present

## 2018-03-27 DIAGNOSIS — M25511 Pain in right shoulder: Secondary | ICD-10-CM | POA: Diagnosis not present

## 2018-03-27 DIAGNOSIS — K219 Gastro-esophageal reflux disease without esophagitis: Secondary | ICD-10-CM | POA: Diagnosis not present

## 2018-03-27 DIAGNOSIS — E7849 Other hyperlipidemia: Secondary | ICD-10-CM | POA: Diagnosis not present

## 2018-03-27 DIAGNOSIS — Z0000 Encounter for general adult medical examination without abnormal findings: Secondary | ICD-10-CM | POA: Diagnosis not present

## 2018-03-28 DIAGNOSIS — Z1212 Encounter for screening for malignant neoplasm of rectum: Secondary | ICD-10-CM | POA: Diagnosis not present

## 2018-04-02 DIAGNOSIS — Z23 Encounter for immunization: Secondary | ICD-10-CM | POA: Diagnosis not present

## 2018-05-22 DIAGNOSIS — Z0120 Encounter for dental examination and cleaning without abnormal findings: Secondary | ICD-10-CM | POA: Diagnosis not present

## 2018-06-12 DIAGNOSIS — G4733 Obstructive sleep apnea (adult) (pediatric): Secondary | ICD-10-CM | POA: Diagnosis not present

## 2018-07-18 DIAGNOSIS — K1329 Other disturbances of oral epithelium, including tongue: Secondary | ICD-10-CM | POA: Diagnosis not present

## 2018-09-06 DIAGNOSIS — K1379 Other lesions of oral mucosa: Secondary | ICD-10-CM | POA: Diagnosis not present

## 2018-11-13 DIAGNOSIS — H900 Conductive hearing loss, bilateral: Secondary | ICD-10-CM | POA: Diagnosis not present

## 2019-01-23 DIAGNOSIS — Z9621 Cochlear implant status: Secondary | ICD-10-CM | POA: Diagnosis not present

## 2019-01-23 DIAGNOSIS — H7492 Unspecified disorder of left middle ear and mastoid: Secondary | ICD-10-CM | POA: Diagnosis not present

## 2019-01-23 DIAGNOSIS — H95192 Other disorders following mastoidectomy, left ear: Secondary | ICD-10-CM | POA: Diagnosis not present

## 2019-01-23 DIAGNOSIS — H90A21 Sensorineural hearing loss, unilateral, right ear, with restricted hearing on the contralateral side: Secondary | ICD-10-CM | POA: Diagnosis not present

## 2019-01-23 DIAGNOSIS — H90A32 Mixed conductive and sensorineural hearing loss, unilateral, left ear with restricted hearing on the contralateral side: Secondary | ICD-10-CM | POA: Diagnosis not present

## 2019-03-17 ENCOUNTER — Other Ambulatory Visit: Payer: Self-pay

## 2019-03-17 ENCOUNTER — Ambulatory Visit (INDEPENDENT_AMBULATORY_CARE_PROVIDER_SITE_OTHER): Payer: Medicare Other | Admitting: Internal Medicine

## 2019-03-17 ENCOUNTER — Encounter: Payer: Self-pay | Admitting: Internal Medicine

## 2019-03-17 VITALS — BP 138/64 | HR 73 | Temp 97.3°F | Ht 74.5 in | Wt 184.8 lb

## 2019-03-17 DIAGNOSIS — R0600 Dyspnea, unspecified: Secondary | ICD-10-CM | POA: Diagnosis not present

## 2019-03-17 DIAGNOSIS — R053 Chronic cough: Secondary | ICD-10-CM

## 2019-03-17 DIAGNOSIS — R0602 Shortness of breath: Secondary | ICD-10-CM | POA: Diagnosis not present

## 2019-03-17 DIAGNOSIS — R05 Cough: Secondary | ICD-10-CM | POA: Diagnosis not present

## 2019-03-17 DIAGNOSIS — R0609 Other forms of dyspnea: Secondary | ICD-10-CM

## 2019-03-17 NOTE — Progress Notes (Addendum)
Subjective:    Patient ID: Mike Stephens, male    DOB: Mar 12, 1951, 68 y.o.   MRN: [id number]  PCP Velna Hatchet, MD   HPI  IOV 03/17/2019  Chief Complaint  Patient presents with  . Consult    Referred by Dr. Ardeth Perfect due to asbestos exposure. Pt states he has an occ cough with occ clear phlegm that pt says he has had for years.       [facility] Integrated Comprehensive Questionnaire  Symptoms: 68 year old male reports a chronic history of shortness of breath and cough of mild to moderate intensity that is stable since its onset.  On the questionnaire he reports onset of dyspnea 1972.  He says several years ago he had a chest x-ray done by Dr. Linna Darner  who was then his primary care physician and there was some changes in the lung that were consistent with him living in the [place].  He says he grew up in [place].  He was also told at that time that the changes were consistent with him having birds in the house.  He said he had pet parakeets in the house.  He says he has mild to moderate dyspnea while climbing up mountains or doing hikes.  It is not necessarily worse.  He says he wants to get to the bottom of all this.  Shortness of breath made worse by exertion and relieved by rest.  Things like bending over and any mask over the face does make him more winded.  He does endorse a mild cough for the last 20 or 30 years since it started it is the same.  He does clear his throat a lot.  He just brings up some phlegm.  It is clear.  There is no hemoptysis or cough when he lies down or cough affecting his voice.  There is no tickle in the back of the throat.  SYMPTOM SCALE - ILD 03/17/2019   O2 use RA  Shortness of Breath 0 -> 5 scale with 5 being worst (score 6 If unable to do)  At rest 0  Simple tasks - showers, clothes change, eating, shaving 1  Household (dishes, doing bed, laundry) 1  Shopping 2  Walking level at own pace 2  Walking keeping up with others of same age 32   Walking up Stairs 4  Walking up [place] 5  Total (40 - 48) Dyspnea Score 17  How bad is your cough? mild  How bad is your fatigue x        Past Medical History :   -Denies any asthma or COPD or heart failure rheumatoid arthritis or scleroderma or lupus or polymyositis or Sjogren's.  Denies any acid reflux or hiatal hernia.  Denies any immune system disorder.  Denies pulmonary hypertension.  Denies stroke denies seizures.  Denies hepatitis.  Denies tuberculosis.  Denies blood clots denies heart disease.  Several decades ago he had pleurisy.  He had pneumonia a few decades ago.  He has had thyroid issues for several years and sleep apnea for not otherwise specified.   ROS: Positive for fatigue and arthralgia in the hands and the back.  For several decades he does notice pain no color change white-red in the fingers with cold weather.  And he does snore for the few decades.  But denies any ulcers or rash or nausea vomiting or recurrent fever or dysphagia   FAMILY HISTORY of LUNG DISEASE: * -Denies pulmonary fibrosis or COPD or asthma  sarcoidosis or cystic fibrosis or hypersensitivity pneumonitis or autoimmune disease.  His mother-in-law is my patient.   EXPOSURE HISTORY:  -Denies smoking any cigarettes or cigars or pipe.  No marijuana use.  No vaping no cocaine no intravenous drug use.   HOME and HOBBY DETAILS :  -[place] the last 32 years.  Age of the home is 39 years.  There is dampness in the laundry room with a leak.  There might be some mold or mildew in the shower curtains.  There is recent mold or mildew in the shower.  No humidifier use.  Does use CPAP but there is no mold in it.  Does not have a nebulizer machine.  No Jacuzzi use.  No misting Fountain inside the house.  He did have a pet bird between the age of [age] years.  He does use a feather pillow.  He does not know of any mold in the [facility] duct.  He does do gardening with compost, woodchips  mulch and flowers.  Overall a lot of hypersensitive pneumonitis type of exposures.   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  He did have a bird as a child.  He does use feather pillow.  Did have a parakeet as a child.  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  Some woodwork at [employer] were rewiring at home.   PULMONARY TOXICITY HISTORY (27 items):  -Denies.  There is no imaging to look at.  Simple office walk 185 feet x  3 laps goal with forehead probe 03/17/2019   O2 used ra  Number laps completed 3  Comments about pace normal  Resting Pulse Ox/HR 100% and 62/min  Final Pulse Ox/HR 100% and 80/min  Desaturated </= 88% no  Desaturated <= 3% points no  Got Tachycardic >/= 90/min no  Symptoms at end of test Very mild if any dyspnea  Miscellaneous comments Normal test        Review of Systems  Constitutional: Negative for fever and unexpected weight change.  HENT: Negative for congestion, dental problem, ear pain, nosebleeds, postnasal drip, rhinorrhea, sinus pressure, sneezing, sore throat and trouble swallowing.   Eyes: Negative for redness and itching.  Respiratory: Positive for cough and shortness of breath. Negative for chest tightness and wheezing.   Cardiovascular: Negative for palpitations and leg swelling.  Gastrointestinal: Negative for nausea and vomiting.  Genitourinary: Negative for dysuria.  Musculoskeletal: Negative for joint swelling.  Skin: Negative for rash.  Allergic/Immunologic: Negative.  Negative for environmental allergies, food allergies and immunocompromised state.  Neurological: Negative for headaches.  Hematological: Does not bruise/bleed easily.  Psychiatric/Behavioral: Negative for dysphoric mood. The patient is not nervous/anxious.       has a past medical history of Benign prostatic hypertrophy, Diverticulosis, Hearing loss in left ear, Heart  murmur, History of mitral valve prolapse, Hyperlipidemia, Hypertension, Hypothyroidism, OSA (obstructive sleep apnea), Sleep apnea, and Tubular adenoma of colon (01/2008).   reports that he has never smoked. He has never used smokeless tobacco.  Past Surgical History:  Procedure Laterality Date  . COLONOSCOPY  2013   negative  . COLONOSCOPY W/ POLYPECTOMY  2009   Tubular Adenoma , Dr.Stark  . FLEXIBLE SIGMOIDOSCOPY  2000   For rectal bleeding   . HERNIA REPAIR    . KNEE ARTHROSCOPY     Right  . mastoid surg    . MIDDLE EAR SURGERY  age 49 for infectious complications  . 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 TONSILLECTOMY    . WISDOM TOOTH EXTRACTION      No Known Allergies  Immunization History  Administered Date(s) Administered  . Hepatitis A 01/03/2005, 07/21/2005  . Hepatitis B 01/03/2005, 02/07/2005, 07/03/2005  . Influenza, High Dose Seasonal PF 03/06/2019  . Td 10/31/2004  . Zoster 12/22/2010    Family History  Problem Relation Age of Onset  . Tuberculosis Father        in college  . Hypertension Mother   . Coronary artery disease Maternal Uncle        died in sleep, > 55  . Cancer Paternal Grandmother         ? primary  . Colon cancer Maternal Uncle   . Stroke Neg Hx   . Colon polyps Neg Hx   . Rectal cancer Neg Hx   . Stomach cancer Neg Hx   . Esophageal cancer Neg Hx      Current Outpatient Medications:  .  aspirin 81 MG tablet, Take 81 mg by mouth daily., Disp: , Rfl:  .  Calcium Citrate 200 MG TABS, Take 400 mg by mouth daily., Disp: , Rfl:  .  Cholecalciferol (VITAMIN D3) 2000 UNITS TABS, Take by mouth daily.  , Disp: , Rfl:  .  Coenzyme Q10 (COQ10) 100 MG CAPS, Take by mouth daily.  , Disp: , Rfl:  .  doxazosin (CARDURA) 8 MG tablet, TAKE ONE-FOURTH TABLET BY MOUTH EVERY DAY, Disp: 90 tablet, Rfl: 0 .  FIBER COMPLETE PO, Take by mouth daily. , Disp: , Rfl:  .  levothyroxine (SYNTHROID, LEVOTHROID) 25 MCG tablet, TAKE ONE AND ONE-HALF  TABLETS BY MOUTH ONCE DAILY,EXCEPT TAKE 1 TABLET BY MOUTH ON WEDNESDAY, Disp: 120 tablet, Rfl: 0 .  Multiple Vitamin (MULTIVITAMIN) tablet, Take 1 tablet by mouth daily.  , Disp: , Rfl:  .  mupirocin ointment (BACTROBAN) 2 %, APPLY TO INCISION AND AROUND BAHA TWICE A DAY FOR 1 WEEK, Disp: , Rfl: 0 .  Omega-3 Fatty Acids (FISH OIL) 1000 MG CAPS, Take 1 capsule by mouth daily., Disp: , Rfl:  .  pravastatin (PRAVACHOL) 40 MG tablet, Take 1 tablet (40 mg total) by mouth daily., Disp: 90 tablet, Rfl: 3  Current Facility-Administered Medications:  .  0.9 %  sodium chloride infusion, 500 mL, Intravenous, Continuous, Ladene Artist, MD      Objective:   Physical Exam  Vitals:   03/17/19 0935  BP: 138/64  Pulse: 73  Temp: (!) 97.3 F (36.3 C)  TempSrc: Temporal  SpO2: 97%  Weight: 184 lb 12.8 oz (83.8 kg)  Height: 6' 2.5" (1.892 m)    Estimated body mass index is 23.41 kg/m as calculated from the following:   Height as of this encounter: 6' 2.5" (1.892 m).   Weight as of this encounter: 184 lb 12.8 oz (83.8 kg).  General Appearance:    Alert, cooperative, no distress, appears stated age - yes , sitting on - chair  Head:    Normocephalic, without obvious abnormality, atraumatic  Eyes:    PERRL, conjunctiva/corneas clear,  Ears:    Normal TM's and external ear canals, both ears  Nose:   Nares normal, septum midline, mucosa normal, no drainage    or sinus tenderness. OXYGEN ON no @ ra  Throat:  MASK  Neck:   Supple, symmetrical, trachea midline, no adenopathy;    thyroid:  no  enlargement/tenderness/nodules; no carotid   bruit or JVD  Back:     Symmetric, no curvature, ROM normal, no CVA tenderness  Lungs:     Distress - no , Wheeze no, Barrell Chest - no, Purse lip breathing - no, Crackles - ? Fine at base - not sure   Chest Wall:    No tenderness or deformity. Scars in chest no   Heart:    Regular rate and rhythm, S1 and S2 normal, no murmur, rub   or gallop  Breast Exam:    NOT  DONE  Abdomen:     Soft, non-tender, bowel sounds active all four quadrants,    no masses, no organomegaly  Genitalia:   NOT DONE  Rectal:   NOT DONE  Extremities:   Extremities normal, atraumatic, Clubbing - no, Edema - no  Pulses:   2+ and symmetric all extremities  Skin:   Stigmata of Connective Tissue Disease - STIGMATA of CONNECTIVE TISSUE DISEASE  - Distal digital fissuring (ie, "mechanic hands") - no - Distal digital tip ulceration - no -Inflammatory arthritis or polyarticular morning joint stiffness ?60 minutes - no - Palmar telangiectasia - no - Raynaud phenomenon - no - Unexplained digital edema - no - Unexplained fixed rash on the digital extensor surfaces (Gottron's sign) - no ... - Deformities of RA - no - Scleroderma  - no - Malar Rash -  no   Lymph nodes:   Cervical, supraclavicular, and axillary nodes normal  Psychiatric:  Neurologic:   pleasant CNII-XII intact, normal strength, sensation  throughout            Assessment & Plan:     ICD-10-CM   1. Dyspnea on exertion  R06.00   2. Chronic cough  R05    Based on chronic cough and shortness of breath along with organic antigen exposure he is at risk for ILD although he did not desaturate.  He did not have any stigmata of ILD.  There might be history of Raynaud's which again puts him at risk for ILD.  His walking desaturation test is fine.  At this point in time it is best to reassess with a high-resolution CT chest and pulmonary function test.  If these are normal then we will have to consider cardiopulmonary stress test and chronic refractory cough/cough neuropathy  Patient Instructions     ICD-10-CM   1. Dyspnea on exertion  R06.00   2. Chronic cough  R05     Do ILD questionnaire - 03/17/2019  Do simple walk test 03/17/2019  Do HRCT supine and prone - next few weeks  Do full PFT - next few weeks any location - if canot be done can wait on this  Followup  - 15 min telephone visit to discuss - CT  results to decide next step  - need not wait till PFT results for the phone visit     SIGNATURE    Dr. Brand Males, M.D., F.C.C.P,  Pulmonary and Critical Care Medicine Staff Physician, [place] Director - Interstitial Lung Disease  Program  Pulmonary [place] [place] at [place], [place], [address]  Pager: [phone], If no answer or between  15:00h - 7:00h: call [phone] Telephone: [phone]  10:11 AM 03/17/2019

## 2019-03-17 NOTE — Addendum Note (Signed)
Addended by: Brand Males on: 03/17/2019 02:06 PM   Modules accepted: Level of Service

## 2019-03-17 NOTE — Patient Instructions (Signed)
ICD-10-CM   1. Dyspnea on exertion  R06.00   2. Chronic cough  R05     Do ILD questionnaire - 03/17/2019  Do simple walk test 03/17/2019  Do HRCT supine and prone - next few weeks  Do full PFT - next few weeks any location - if canot be done can wait on this  Followup  - 15 min telephone visit to discuss - CT results to decide next step  - need not wait till PFT results for the phone visit

## 2019-04-07 DIAGNOSIS — H01009 Unspecified blepharitis unspecified eye, unspecified eyelid: Secondary | ICD-10-CM | POA: Diagnosis not present

## 2019-04-07 DIAGNOSIS — L920 Granuloma annulare: Secondary | ICD-10-CM | POA: Diagnosis not present

## 2019-04-07 DIAGNOSIS — L570 Actinic keratosis: Secondary | ICD-10-CM | POA: Diagnosis not present

## 2019-04-08 ENCOUNTER — Other Ambulatory Visit: Payer: Medicare Other

## 2019-04-10 DIAGNOSIS — Z125 Encounter for screening for malignant neoplasm of prostate: Secondary | ICD-10-CM | POA: Diagnosis not present

## 2019-04-10 DIAGNOSIS — E038 Other specified hypothyroidism: Secondary | ICD-10-CM | POA: Diagnosis not present

## 2019-04-10 DIAGNOSIS — E7849 Other hyperlipidemia: Secondary | ICD-10-CM | POA: Diagnosis not present

## 2019-04-15 DIAGNOSIS — L603 Nail dystrophy: Secondary | ICD-10-CM | POA: Diagnosis not present

## 2019-04-15 DIAGNOSIS — H9190 Unspecified hearing loss, unspecified ear: Secondary | ICD-10-CM | POA: Diagnosis not present

## 2019-04-15 DIAGNOSIS — K219 Gastro-esophageal reflux disease without esophagitis: Secondary | ICD-10-CM | POA: Diagnosis not present

## 2019-04-15 DIAGNOSIS — Z0000 Encounter for general adult medical examination without abnormal findings: Secondary | ICD-10-CM | POA: Diagnosis not present

## 2019-04-16 DIAGNOSIS — R82998 Other abnormal findings in urine: Secondary | ICD-10-CM | POA: Diagnosis not present

## 2019-04-16 DIAGNOSIS — Z1212 Encounter for screening for malignant neoplasm of rectum: Secondary | ICD-10-CM | POA: Diagnosis not present

## 2019-04-28 DIAGNOSIS — M25532 Pain in left wrist: Secondary | ICD-10-CM | POA: Diagnosis not present

## 2019-05-06 ENCOUNTER — Other Ambulatory Visit: Payer: Self-pay

## 2019-05-06 ENCOUNTER — Ambulatory Visit (INDEPENDENT_AMBULATORY_CARE_PROVIDER_SITE_OTHER)
Admission: RE | Admit: 2019-05-06 | Discharge: 2019-05-06 | Disposition: A | Discharge status: Home / Self Care | Payer: Medicare Other | Source: Ambulatory Visit | Attending: Internal Medicine | Admitting: Internal Medicine

## 2019-05-06 DIAGNOSIS — R918 Other nonspecific abnormal finding of lung field: Secondary | ICD-10-CM | POA: Diagnosis not present

## 2019-05-06 DIAGNOSIS — R0602 Shortness of breath: Secondary | ICD-10-CM | POA: Diagnosis not present

## 2019-05-10 ENCOUNTER — Encounter (HOSPITAL_COMMUNITY): Payer: Self-pay | Admitting: Emergency Medicine

## 2019-05-10 ENCOUNTER — Emergency Department (HOSPITAL_COMMUNITY)
Admission: EM | Admit: 2019-05-10 | Discharge: 2019-05-10 | Disposition: A | Discharge status: Home / Self Care | Payer: Medicare Other | Attending: Emergency Medicine | Admitting: Emergency Medicine

## 2019-05-10 ENCOUNTER — Other Ambulatory Visit: Payer: Self-pay

## 2019-05-10 ENCOUNTER — Emergency Department (HOSPITAL_COMMUNITY): Payer: Medicare Other

## 2019-05-10 DIAGNOSIS — E039 Hypothyroidism, unspecified: Secondary | ICD-10-CM | POA: Diagnosis not present

## 2019-05-10 DIAGNOSIS — Z79899 Other long term (current) drug therapy: Secondary | ICD-10-CM | POA: Diagnosis not present

## 2019-05-10 DIAGNOSIS — R1032 Left lower quadrant pain: Secondary | ICD-10-CM | POA: Diagnosis not present

## 2019-05-10 DIAGNOSIS — Z7982 Long term (current) use of aspirin: Secondary | ICD-10-CM | POA: Diagnosis not present

## 2019-05-10 DIAGNOSIS — R1084 Generalized abdominal pain: Secondary | ICD-10-CM | POA: Diagnosis present

## 2019-05-10 DIAGNOSIS — K5790 Diverticulosis of intestine, part unspecified, without perforation or abscess without bleeding: Secondary | ICD-10-CM | POA: Diagnosis not present

## 2019-05-10 DIAGNOSIS — R109 Unspecified abdominal pain: Secondary | ICD-10-CM | POA: Diagnosis not present

## 2019-05-10 DIAGNOSIS — R55 Syncope and collapse: Secondary | ICD-10-CM | POA: Diagnosis not present

## 2019-05-10 DIAGNOSIS — R1033 Periumbilical pain: Secondary | ICD-10-CM | POA: Insufficient documentation

## 2019-05-10 DIAGNOSIS — I10 Essential (primary) hypertension: Secondary | ICD-10-CM | POA: Diagnosis not present

## 2019-05-10 LAB — TROPONIN I (HIGH SENSITIVITY): Troponin I (High Sensitivity): 5 ng/L (ref <18)

## 2019-05-10 LAB — COMPREHENSIVE METABOLIC PANEL
ALT: 29 U/L (ref 0–44)
AST: 27 U/L (ref 15–41)
Albumin: 4.6 g/dL (ref 3.5–5.0)
Alkaline Phosphatase: 47 U/L (ref 38–126)
Anion gap: 8 (ref 5–15)
BUN: 18 mg/dL (ref 8–23)
CO2: 25 mmol/L (ref 22–32)
Calcium: 9.5 mg/dL (ref 8.9–10.3)
Chloride: 105 mmol/L (ref 98–111)
Creatinine, Ser: 0.74 mg/dL (ref 0.61–1.24)
GFR calc Af Amer: >60 mL/min (ref >60)
GFR calc non Af Amer: >60 mL/min (ref >60)
Glucose, Bld: 145 mg/dL — ABNORMAL HIGH (ref 70–99)
Potassium: 4.1 mmol/L (ref 3.5–5.1)
Sodium: 138 mmol/L (ref 135–145)
Total Bilirubin: 0.7 mg/dL (ref 0.3–1.2)
Total Protein: 7.6 g/dL (ref 6.5–8.1)

## 2019-05-10 LAB — URINALYSIS, ROUTINE W REFLEX MICROSCOPIC
Bilirubin Urine: NEGATIVE
Glucose, UA: NEGATIVE mg/dL
Hgb urine dipstick: NEGATIVE
Ketones, ur: 5 mg/dL — AB
Leukocytes,Ua: NEGATIVE
Nitrite: NEGATIVE
Protein, ur: NEGATIVE mg/dL
Specific Gravity, Urine: 1.026 (ref 1.005–1.030)
pH: 5 (ref 5.0–8.0)

## 2019-05-10 LAB — CBC
HCT: 43.2 % (ref 39.0–52.0)
Hemoglobin: 14.3 g/dL (ref 13.0–17.0)
MCH: 32.9 pg (ref 26.0–34.0)
MCHC: 33.1 g/dL (ref 30.0–36.0)
MCV: 99.5 fL (ref 80.0–100.0)
Platelets: 153 10*3/uL (ref 150–400)
RBC: 4.34 MIL/uL (ref 4.22–5.81)
RDW: 13.1 % (ref 11.5–15.5)
WBC: 8.6 10*3/uL (ref 4.0–10.5)
nRBC: 0 % (ref 0.0–0.2)

## 2019-05-10 LAB — LIPASE, BLOOD: Lipase: 30 U/L (ref 11–51)

## 2019-05-10 MED ORDER — IOHEXOL 300 MG/ML  SOLN
100.0000 mL | Freq: Once | INTRAMUSCULAR | Status: AC | PRN
  Administered 2019-05-10: 100 mL via INTRAVENOUS

## 2019-05-10 MED ORDER — DICYCLOMINE HCL 20 MG PO TABS: 20.0000 mg | ORAL_TABLET | Freq: Two times a day (BID) | ORAL | 0 refills | Status: DC | PRN

## 2019-05-10 MED ORDER — SODIUM CHLORIDE (PF) 0.9 % IJ SOLN
INTRAMUSCULAR | Status: AC
  Filled 2019-05-10: qty 50

## 2019-05-10 MED ORDER — SODIUM CHLORIDE 0.9 % IV BOLUS
1000.0000 mL | Freq: Once | INTRAVENOUS | Status: AC
  Administered 2019-05-10: 1000 mL via INTRAVENOUS

## 2019-05-10 MED ORDER — ACETAMINOPHEN 500 MG PO TABS
1000.0000 mg | ORAL_TABLET | Freq: Once | ORAL | Status: AC
  Administered 2019-05-10: 1000 mg via ORAL
  Filled 2019-05-10: qty 2

## 2019-05-10 NOTE — ED Triage Notes (Signed)
Per pt, states he woke with lower abdominal pain-went to bathroom to see if it would relieve pain-states he passed out on the toilet-states he then had 5 small BM's with blood in them-he and his wife took pictures and retrieved 2 samples

## 2019-05-10 NOTE — ED Provider Notes (Signed)
[facility] DEPT Provider Note   CSN: [id number] Arrival date & time: 05/10/19  1443     History Chief Complaint  Patient presents with  . Abdominal Pain    TOSHIYUKI JENSEN is a 69 y.o. male hx of hypertension, hyperlipidemia, here presenting with abdominal pain, near syncope.  Patient states that he started having abdominal cramps today.  He then sat on the commode and had multiple small bowel movements.  He states that one of the bowel movements has some blood in it.  He states that he had so much pain that he eventually passed out on the commode.  He leaned backwards and this was witnessed by his wife.  He had no head injury and did not fall off the commode.  Denies any chest pain or shortness of breath.  The history is provided by the patient.       Past Medical History:  Diagnosis Date  . Benign prostatic hypertrophy   . Diverticulosis   . Hearing loss in left ear   . Heart murmur   . History of mitral valve prolapse    ( clinically NO Mitral regurgitation)   . Hyperlipidemia   . Hypertension   . Hypothyroidism   . OSA (obstructive sleep apnea)    [facility]  . Sleep apnea    no CPAP used in "Awhile" due to collapsed eardrum  . Tubular adenoma of colon 01/2008   Dr Fuller Plan    Patient Active Problem List   Diagnosis Date Noted  . OSA (obstructive sleep apnea) 12/22/2010  . Nonspecific abnormal electrocardiogram (ECG) (EKG) 11/09/2008  . DIVERTICULOSIS, COLON 11/09/2008  . COLONIC POLYPS, HX OF 11/09/2008  . HYPERLIPIDEMIA 11/08/2007  . HYPOTHYROIDISM 07/05/2007    Past Surgical History:  Procedure Laterality Date  . COLONOSCOPY  2013   negative  . COLONOSCOPY W/ POLYPECTOMY  2009   Tubular Adenoma , Dr.Stark  . FLEXIBLE SIGMOIDOSCOPY  2000   For rectal bleeding   . HERNIA REPAIR    . KNEE ARTHROSCOPY     Right  . mastoid surg    . MIDDLE EAR SURGERY     age 19 for infectious complications  . ORCHIECTOMY     age 20  ? undescended  . TONSILLECTOMY    . WISDOM TOOTH EXTRACTION         Family History  Problem Relation Age of Onset  . Tuberculosis Father        in college  . Hypertension Mother   . Coronary artery disease Maternal Uncle        died in sleep, > 55  . Cancer Paternal Grandmother         ? primary  . Colon cancer Maternal Uncle   . Stroke Neg Hx   . Colon polyps Neg Hx   . Rectal cancer Neg Hx   . Stomach cancer Neg Hx   . Esophageal cancer Neg Hx     Social History   Tobacco Use  . Smoking status: Never Smoker  . Smokeless tobacco: Never Used  Substance Use Topics  . Alcohol use: No  . Drug use: No    Home Medications Prior to Admission medications   Medication Sig Start Date End Date Taking? Authorizing Provider  aspirin 81 MG tablet Take 81 mg by mouth daily.    [provider]  Calcium Citrate 200 MG TABS Take 400 mg by mouth daily.    [provider]  Cholecalciferol (  VITAMIN D3) 2000 UNITS TABS Take by mouth daily.      [provider]  Coenzyme Q10 (COQ10) 100 MG CAPS Take by mouth daily.      [provider]  doxazosin (CARDURA) 8 MG tablet TAKE ONE-FOURTH TABLET BY MOUTH EVERY DAY 03/10/13   Hendricks Limes, MD  FIBER COMPLETE PO Take by mouth daily.     [provider]  levothyroxine (SYNTHROID, LEVOTHROID) 25 MCG tablet TAKE ONE AND ONE-HALF TABLETS BY MOUTH ONCE DAILY,EXCEPT TAKE 1 TABLET BY MOUTH ON [facility] 03/23/14   Hendricks Limes, MD  Multiple Vitamin (MULTIVITAMIN) tablet Take 1 tablet by mouth daily.      [provider]  mupirocin ointment (BACTROBAN) 2 % APPLY TO INCISION AND AROUND BAHA TWICE A DAY FOR 1 WEEK 03/22/16   [provider]  Omega-3 Fatty Acids (FISH OIL) 1000 MG CAPS Take 1 capsule by mouth daily.    [provider]  pravastatin (PRAVACHOL) 40 MG tablet Take 1 tablet (40 mg total) by mouth daily. 02/11/13   Hendricks Limes, MD    Allergies      Patient has no known allergies.  Review of Systems   Review of Systems  Gastrointestinal: Positive for abdominal pain.  All other systems reviewed and are negative.   Physical Exam Updated Vital Signs BP (!) 150/72   Pulse 69   Temp 100 F (37.8 C) (Oral)   Resp 15   SpO2 100%   Physical Exam Vitals and nursing note reviewed.  HENT:     Head: Normocephalic.  Eyes:     Extraocular Movements: Extraocular movements intact.  Cardiovascular:     Rate and Rhythm: Normal rate and regular rhythm.     Heart sounds: Normal heart sounds.  Pulmonary:     Effort: Pulmonary effort is normal.     Breath sounds: Normal breath sounds.  Abdominal:     General: Abdomen is flat.     Comments: Mild LLQ and periumbilical tenderness   Skin:    General: Skin is warm.     Capillary Refill: Capillary refill takes less than 2 seconds.  Neurological:     General: No focal deficit present.     Mental Status: He is alert and oriented to person, place, and time.  Psychiatric:        Mood and Affect: Mood normal.        Behavior: Behavior normal.     ED Results / Procedures / Treatments   Labs (all labs ordered are listed, but only abnormal results are displayed) Labs Reviewed  COMPREHENSIVE METABOLIC PANEL - Abnormal; Notable for the following components:      Result Value   Glucose, Bld 145 (*)    All other components within normal limits  URINALYSIS, ROUTINE W REFLEX MICROSCOPIC - Abnormal; Notable for the following components:   Color, Urine AMBER (*)    APPearance HAZY (*)    Ketones, ur 5 (*)    All other components within normal limits  LIPASE, BLOOD  CBC  TROPONIN I (HIGH SENSITIVITY)    EKG EKG Interpretation  Date/Time:  Saturday May 10 2019 15:07:07 EST Ventricular Rate:  68 PR Interval:    QRS Duration: 91 QT Interval:  395 QTC Calculation: 421 R Axis:   46 Text Interpretation: Sinus rhythm Anterior infarct, old No previous ECGs available Confirmed by Wandra Arthurs [phone]) on 05/10/2019 4:28:28 PM   Radiology CT ABDOMEN PELVIS W CONTRAST  Result  Date: 05/10/2019 CLINICAL DATA:  Abdominal pain and bloody stools. EXAM: CT ABDOMEN AND PELVIS WITH CONTRAST TECHNIQUE: Multidetector CT imaging of the abdomen and pelvis was performed using the standard protocol following bolus administration of intravenous contrast. CONTRAST:  189mL OMNIPAQUE IOHEXOL 300 MG/ML  SOLN COMPARISON:  None. FINDINGS: Lower chest: The lung bases are clear of acute process. No pleural effusion or pulmonary lesions. Calcified granuloma noted at the right lung base along with some calcified scarring changes. The heart is normal in size. No pericardial effusion. The distal esophagus and aorta are unremarkable. Hepatobiliary: No focal hepatic lesions or intrahepatic biliary dilatation. Gallbladder is normal. No common bile duct dilatation. Pancreas: No mass, inflammation or ductal dilatation. Spleen: Normal size.  No focal lesions. Adrenals/Urinary Tract: The adrenal glands and kidneys are unremarkable. No worrisome renal or bladder lesions. Small lower pole left renal calculus is noted. Stomach/Bowel: The stomach, duodenum, small bowel and colon are grossly normal without oral contrast. No acute inflammatory changes, mass lesions or obstructive findings. The terminal ileum is normal. The appendix is not identified for certain but no findings to suggest acute appendicitis. Advanced sigmoid colon diverticulosis but no findings for acute diverticulitis. Vascular/Lymphatic: Scattered atherosclerotic calcifications involving the aorta but no aneurysm or dissection. The branch vessels are patent. The major venous structures are patent. No mesenteric or retroperitoneal mass or adenopathy. Small scattered lymph nodes are noted. Reproductive: The prostate gland and seminal vesicles are unremarkable. Other: No pelvic mass or adenopathy. No free pelvic fluid collections. No inguinal mass or adenopathy. No  abdominal wall hernia or subcutaneous lesions. Musculoskeletal: No significant bony findings. IMPRESSION: 1. No acute abdominal/pelvic findings, mass lesions or adenopathy. 2. Sigmoid colon diverticulosis but no findings for acute diverticulitis. Electronically Signed   By: Marijo Sanes M.D.   On: 05/10/2019 17:52    Procedures Procedures (including critical care time)  Medications Ordered in ED Medications  sodium chloride 0.9 % bolus 1,000 mL (1,000 mLs Intravenous New Bag/Given 05/10/19 1722)  acetaminophen (TYLENOL) tablet 1,000 mg (1,000 mg Oral Given 05/10/19 1722)  sodium chloride (PF) 0.9 % injection (  Given by Other 05/10/19 1828)  iohexol (OMNIPAQUE) 300 MG/ML solution 100 mL (100 mLs Intravenous Contrast Given 05/10/19 1735)    ED Course  I have reviewed the triage vital signs and the nursing notes.  Pertinent labs & imaging results that were available during my care of the patient were reviewed by me and considered in my medical decision making (see chart for details).    MDM Rules/Calculators/A&P                      EILEEN GAGEN is a 69 y.o. male here with ab pain, near syncope. Consider diverticulitis vs diverticulosis vs appendicitis. I doubt ACS. Will get labs, CT ab/pel, UA. Will hydrate and reassess.   6:33 PM Labs unremarkable, CBC stable, CT abdomen pelvis showed diverticulosis without diverticulitis.  He is not on any blood thinners.  He has good GI follow-up as well.  I think his pain likely gas or some abdominal cramps.  We will give Bentyl as needed and have him follow-up with his GI doctor  Final Clinical Impression(s) / ED Diagnoses Final diagnoses:  None    Rx / DC Orders ED Discharge Orders    None       Drenda Freeze, MD 05/10/19 [id number]

## 2019-05-10 NOTE — ED Notes (Signed)
Patient has a gold top in the main lab

## 2019-05-10 NOTE — Discharge Instructions (Addendum)
You may have some bleeding and cramps   Take bentyl as needed for cramps   Call your GI doctor for follow up   Return to ER if you have uncontrolled bleeding, severe pain, vomiting.

## 2019-05-12 ENCOUNTER — Telehealth: Payer: Self-pay | Admitting: Gastroenterology

## 2019-05-12 NOTE — Telephone Encounter (Signed)
Patient has been scheduled for 06/12/19.  They are offered an earlier appt with an APP, they decline.

## 2019-05-12 NOTE — Telephone Encounter (Signed)
Pt's wife Angela Nevin would like a call back. She states that he took pt to the ED last weekend following Amy's advise. Pt had a ct scan done, she would like to speak with the nurse about test and ED visit. Pls call her.

## 2019-05-15 ENCOUNTER — Ambulatory Visit (INDEPENDENT_AMBULATORY_CARE_PROVIDER_SITE_OTHER): Payer: Medicare Other | Admitting: Pulmonary Disease

## 2019-05-15 ENCOUNTER — Other Ambulatory Visit: Payer: Self-pay

## 2019-05-15 ENCOUNTER — Encounter: Payer: Self-pay | Admitting: Pulmonary Disease

## 2019-05-15 DIAGNOSIS — Z0000 Encounter for general adult medical examination without abnormal findings: Secondary | ICD-10-CM | POA: Insufficient documentation

## 2019-05-15 DIAGNOSIS — R918 Other nonspecific abnormal finding of lung field: Secondary | ICD-10-CM | POA: Insufficient documentation

## 2019-05-15 DIAGNOSIS — R0602 Shortness of breath: Secondary | ICD-10-CM | POA: Insufficient documentation

## 2019-05-15 DIAGNOSIS — G4733 Obstructive sleep apnea (adult) (pediatric): Secondary | ICD-10-CM

## 2019-05-15 NOTE — Progress Notes (Signed)
Virtual Visit via Telephone Note  I connected with Mike Stephens on 05/15/19 at  1:30 PM EST by telephone and verified that I am speaking with the correct person using two identifiers.  Location: Patient: Home Provider: [facility] - [id number], [name], [place], [place] [address]   I discussed the limitations, risks, security and privacy concerns of performing an evaluation and management service by telephone and the availability of in person appointments. I also discussed with the patient that there may be a patient responsible charge related to this service. The patient expressed understanding and agreed to proceed.  Patient consented to consult via telephone: Yes People present and their role in pt care: Pt     History of Present Illness:  69 year old male never smoker reconsulted with our practice on 03/17/2019 for dyspnea on exertion  Past medical history: Obstructive sleep apnea, hyperlipidemia, diverticulosis Smoking history: Never Smoker  Maintenance: Patient of Dr. Chase Caller  Chief complaint: Follow-up, complete high-resolution CT chest  69 year old male never smoker last seen in our office on 03/17/2019 which was a consult for dyspnea on exertion.  At that time there was concern for potential interstitial lung disease and potential history of Raynaud's.  A high-resolution CT chest was ordered as well as a pulmonary function test.  Patient completed an ILD questionnaire at that time as well as a simple walk test.  Patient was able to complete the high-resolution CT chest which showed no evidence of interstitial lung disease.  Patient reported that he feels okay and about the same as he felt in November/2020.  He still having aspects of dyspnea specifically with physical exertion.  Patient admits he is not very active.  Patient's wife as well as him are interested in receiving the Covid vaccine  Patient reports that he is extremely compliant with using  his CPAP.  He has no current issues.  He is not currently established with a sleep MD.  He previously saw Dr. Gwenette Greet.  Observations/Objective:  05/06/2019-CT chest high-res-no evidence of interstitial lung disease, no evidence of asbestos related pleural disease, scattered small solid pulmonary nodules largest being 4 mm, no follow-up needed if patient is low risk, one-vessel coronary arthrosclerosis  08/17/2015-CT maxillofacial-paranasal sinuses clear aside from minimal mucosal edema at the ostium of the maxillary sinus bilaterally  Social History   Tobacco Use  Smoking Status Never Smoker  Smokeless Tobacco Never Used   Immunization History  Administered Date(s) Administered  . Hepatitis A 01/03/2005, 07/21/2005  . Hepatitis B 01/03/2005, 02/07/2005, 07/03/2005  . Influenza, High Dose Seasonal PF 03/06/2019  . Td 10/31/2004  . Zoster 12/22/2010   SIX MIN WALK 03/17/2019  Supplimental Oxygen during Test? (L/min) No  Tech Comments: Pt walked at an average pace completing all required laps and had complaints of very mild SOB.     Assessment and Plan:  Healthcare maintenance COVID-19 Vaccine Information can be found at: ShippingScam.co.uk For questions related to vaccine distribution or appointments, please email [email or web address] or call [phone].     OSA (obstructive sleep apnea) Plan:  Continue CPAP therapy   Abnormal findings on diagnostic imaging of lung Hx of abnormal chest xray  HRCT -no interstitial lung disease, small scattered pulmonary nodules  Plan: We will coordinate pulmonary function testing  Shortness of breath Patient continues to have dyspnea specifically with exertion High-resolution CT chest looks good  Plan: We will coordinate pulmonary function testing   Follow Up Instructions:  Return in about 2 months (around 07/13/2019),  or if symptoms worsen or fail to improve, for Follow  up for PFT, Follow up with Wyn Quaker FNP-C, Follow up with Dr. Purnell Shoemaker.   I discussed the assessment and treatment plan with the patient. The patient was provided an opportunity to ask questions and all were answered. The patient agreed with the plan and demonstrated an understanding of the instructions.   The patient was advised to call back or seek an in-person evaluation if the symptoms worsen or if the condition fails to improve as anticipated.  I provided 34 minutes of non-face-to-face time during this encounter.   Lauraine Rinne, NP

## 2019-05-15 NOTE — Assessment & Plan Note (Signed)
Plan: Continue CPAP therapy 

## 2019-05-15 NOTE — Assessment & Plan Note (Signed)
Hx of abnormal chest xray  HRCT -no interstitial lung disease, small scattered pulmonary nodules  Plan: We will coordinate pulmonary function testing

## 2019-05-15 NOTE — Assessment & Plan Note (Signed)
Patient continues to have dyspnea specifically with exertion High-resolution CT chest looks good  Plan: We will coordinate pulmonary function testing

## 2019-05-15 NOTE — Assessment & Plan Note (Signed)
COVID-19 Vaccine Information can be found at: [email or web address] For questions related to vaccine distribution or appointments, please email [email or web address] or call [phone].    

## 2019-05-15 NOTE — Patient Instructions (Addendum)
You were seen today by Lauraine Rinne, NP  for:   1. Shortness of breath  We will work to get you scheduled for pulmonary function test  2. Abnormal findings on diagnostic imaging of lung  High-resolution CT chest looks clear  3. Healthcare maintenance  COVID-19 Vaccine Information can be found at: ShippingScam.co.uk For questions related to vaccine distribution or appointments, please email [email or web address] or call [phone].   4. OSA (obstructive sleep apnea)  We recommend that you continue using your CPAP daily >>>Keep up the hard work using your device >>> Goal should be wearing this for the entire night that you are sleeping, at least 4 to 6 hours  Remember:  . Do not drive or operate heavy machinery if tired or drowsy.  . Please notify the supply company and office if you are unable to use your device regularly due to missing supplies or machine being broken.  . Work on maintaining a healthy weight and following your recommended nutrition plan  . Maintain proper daily exercise and movement  . Maintaining proper use of your device can also help improve management of other chronic illnesses such as: Blood pressure, blood sugars, and weight management.   BiPAP/ CPAP Cleaning:  >>>Clean weekly, with Dawn soap, and bottle brush.  Set up to air dry. >>> Wipe mask out daily with wet wipe or towelette   Please let me know if you ever need to be established with a sleep MD therefore in our office that we can get you established with   Follow Up:    Return in about 2 months (around 07/13/2019), or if symptoms worsen or fail to improve, for Follow up for PFT, Follow up with Wyn Quaker FNP-C, Follow up with Dr. Purnell Shoemaker.   Please do your part to reduce the spread of COVID-19:      Reduce your risk of any infection  and COVID19 by using the similar precautions used for avoiding the common cold or flu:  Marland Kitchen Wash  your hands often with soap and warm water for at least 20 seconds.  If soap and water are not readily available, use an alcohol-based hand sanitizer with at least 60% alcohol.  . If coughing or sneezing, cover your mouth and nose by coughing or sneezing into the elbow areas of your shirt or coat, into a tissue or into your sleeve (not your hands). Langley Gauss A MASK when in public  . Avoid shaking hands with others and consider head nods or verbal greetings only. . Avoid touching your eyes, nose, or mouth with unwashed hands.  . Avoid close contact with people who are sick. . Avoid places or events with large numbers of people in one location, like concerts or sporting events. . If you have some symptoms but not all symptoms, continue to monitor at home and seek medical attention if your symptoms worsen. . If you are having a medical emergency, call 911.   Terry / e-Visit: [email or web address]         [facility]: [facility]: [id number]                   [facility]: [id number]     It is flu season:   >>> Best ways to protect herself from the flu: Receive the yearly flu vaccine, practice good hand hygiene washing with soap and also using hand sanitizer when available, eat a  nutritious meals, get adequate rest, hydrate appropriately   Please contact the office if your symptoms worsen or you have concerns that you are not improving.   Thank you for choosing [facility] for your healthcare, and for allowing [place] to partner with you on your healthcare journey. I am thankful to be able to provide care to you today.   Wyn Quaker FNP-C

## 2019-06-02 ENCOUNTER — Telehealth: Payer: Self-pay | Admitting: Internal Medicine

## 2019-06-02 NOTE — Telephone Encounter (Signed)
Spoke with pt and his wife. They are wanting MR to review the CT the pt had on 05/10/19, CT abdomen and pelvis.  MR - please advise. Thanks.

## 2019-06-02 NOTE — Telephone Encounter (Signed)
LMTCB to give CT results and advise on recs per MR

## 2019-06-02 NOTE — Telephone Encounter (Signed)
He had a CT abd/pelvis 05/10/2019  IMPRESSION: 1. No acute abdominal/pelvic findings, mass lesions or adenopathy. 2. Sigmoid colon diverticulosis but no findings for acute diverticulitis.   Electronically Signed   By: Marijo Sanes M.D.   On: 05/10/2019 17:52   Plan - keep gi appt followup 06/12/2019 with Dr Fuller Plan  - keep PFFT and visit with Aaron Edelman mid march

## 2019-06-03 NOTE — Telephone Encounter (Signed)
Called patient and advised of response from Dr. Chase Caller. Patient voiced understanding. Nothing further needed at this time.

## 2019-06-03 NOTE — Telephone Encounter (Signed)
ATC pt, no answer. Left message for pt to call back.  

## 2019-06-03 NOTE — Telephone Encounter (Signed)
Pt returning a phone call. Pt can be reached at [phone]

## 2019-06-09 DIAGNOSIS — L570 Actinic keratosis: Secondary | ICD-10-CM | POA: Diagnosis not present

## 2019-06-09 DIAGNOSIS — L814 Other melanin hyperpigmentation: Secondary | ICD-10-CM | POA: Diagnosis not present

## 2019-06-12 ENCOUNTER — Ambulatory Visit: Payer: Medicare Other | Admitting: Gastroenterology

## 2019-06-18 DIAGNOSIS — Z0120 Encounter for dental examination and cleaning without abnormal findings: Secondary | ICD-10-CM | POA: Diagnosis not present

## 2019-06-30 ENCOUNTER — Ambulatory Visit: Payer: Medicare Other | Admitting: Physician Assistant

## 2019-06-30 ENCOUNTER — Encounter: Payer: Self-pay | Admitting: Physician Assistant

## 2019-06-30 VITALS — BP 142/70 | HR 69 | Temp 97.7°F | Ht 74.5 in | Wt 192.2 lb

## 2019-06-30 DIAGNOSIS — R109 Unspecified abdominal pain: Secondary | ICD-10-CM | POA: Diagnosis not present

## 2019-06-30 DIAGNOSIS — K921 Melena: Secondary | ICD-10-CM | POA: Diagnosis not present

## 2019-06-30 NOTE — Progress Notes (Signed)
Chief Complaint: Abdominal pain, rectal bleeding  HPI:     Mike Stephens is a 69 year old Caucasian male with a past medical history as listed below, who was referred to me by Velna Hatchet, MD for a complaint of abdominal pain and rectal bleeding.      05/10/2016 Dr. Fuller Plan colonoscopy with 3 5-6 mm polyps in the transverse colon and cecum, internal hemorrhoids, diverticulosis in the sigmoid colon and in the descending colon otherwise normal.  Repeat recommended in 5 years for surveillance.  Pathology was sessile serrated polyps.    05/10/2019 patient seen in the ER for abdominal cramping and near syncope.  Described multiple small bowel movements some with blood in and then having some much pain eventually passed out on the commode.  Labs at that time showed a normal CMP, urinalysis, lipase, CBC and troponin.  CT abdomen pelvis with contrast showed no acute abdominal/pelvic findings, mass lesions or adenopathy.  Sigmoid colon diverticulosis without acute diverticulitis.  Patient was given Bentyl as needed.    Today, the patient presents to clinic and explains that he had the episode as above of severe abdominal cramping which was followed by quite a large amount of bright red blood in the toilet.  Explains that this all started the night before he thinks.  He ate some [place] sausage on a pizza which typically upsets his stomach giving him heartburn and noticed that he was somewhat bloated that evening before going to bed.  He woke up at around 5:00 in the morning with intense cramping which sent him to the toilet, he was on the toilet for a large period of time trying to pass stool but was only passing small amounts and the next thing he knows his wife found him having recently passed out leaning back against the toilet about an hour later, at that time he was soaked in sweat.  Eventually the cramping slowed down and when he looked at his stool there was quite a bit of bright red blood in the toilet.  He  had no further events after that and the pain completely dissipated.  He has been completely fine since that time.  Has returned to his normal soft solid bowel movements and has seen no further bleeding.    Does tell me that back in his youth, in junior high, around the springtime he used to get some severe epigastric cramping which would send him out of his exams at times to go to the bathroom, this was somewhat similar to this cramping though he never had bleeding back then.    Denies fever, chills or weight loss.  Past Medical History:  Diagnosis Date  . Benign prostatic hypertrophy   . Diverticulosis   . Hearing loss in left ear   . Heart murmur   . History of mitral valve prolapse    ( clinically NO Mitral regurgitation)   . Hyperlipidemia   . Hypertension   . Hypothyroidism   . OSA (obstructive sleep apnea)    [facility]  . Sleep apnea    no CPAP used in "Awhile" due to collapsed eardrum  . Tubular adenoma of colon 01/2008   Dr Fuller Plan    Past Surgical History:  Procedure Laterality Date  . COLONOSCOPY  2013   negative  . COLONOSCOPY W/ POLYPECTOMY  2009   Tubular Adenoma , Dr.Stark  . FLEXIBLE SIGMOIDOSCOPY  2000   For rectal bleeding   . HERNIA REPAIR    . KNEE ARTHROSCOPY  Right  . mastoid surg    . MIDDLE EAR SURGERY     age 73 for infectious complications  . 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 TONSILLECTOMY    . WISDOM TOOTH EXTRACTION      Current Outpatient Medications  Medication Sig Dispense Refill  . aspirin 81 MG tablet Take 81 mg by mouth daily.    . Calcium Citrate 200 MG TABS Take 400 mg by mouth daily.    . Cholecalciferol (VITAMIN D3) 2000 UNITS TABS Take by mouth daily.      . Coenzyme Q10 (COQ10) 100 MG CAPS Take by mouth daily.      Marland Kitchen doxazosin (CARDURA) 8 MG tablet TAKE ONE-FOURTH TABLET BY MOUTH EVERY DAY 90 tablet 0  . FIBER COMPLETE PO Take by mouth daily.     Marland Kitchen levothyroxine (SYNTHROID, LEVOTHROID) 25 MCG tablet TAKE ONE  AND ONE-HALF TABLETS BY MOUTH ONCE DAILY,EXCEPT TAKE 1 TABLET BY MOUTH ON WEDNESDAY 120 tablet 0  . Multiple Vitamin (MULTIVITAMIN) tablet Take 1 tablet by mouth daily.      . Omega-3 Fatty Acids (FISH OIL) 1000 MG CAPS Take 1 capsule by mouth daily.    . pravastatin (PRAVACHOL) 40 MG tablet Take 1 tablet (40 mg total) by mouth daily. 90 tablet 3   Current Facility-Administered Medications  Medication Dose Route Frequency Provider Last Rate Last Admin  . 0.9 %  sodium chloride infusion  500 mL Intravenous Continuous Ladene Artist, MD        Allergies as of 06/30/2019  . (No Known Allergies)    Family History  Problem Relation Age of Onset  . Tuberculosis Father        in college  . Hypertension Mother   . Coronary artery disease Maternal Uncle        died in sleep, > 55  . Cancer Paternal Grandmother         ? primary  . Colon cancer Maternal Uncle   . Stroke Neg Hx   . Colon polyps Neg Hx   . Rectal cancer Neg Hx   . Stomach cancer Neg Hx   . Esophageal cancer Neg Hx     Social History   Socioeconomic History  . Marital status: Married    Spouse name: Not on file  . Number of children: 2  . Years of education: Not on file  . Highest education level: Not on file  Occupational History  . Occupation: Retried    Fish farm manager: [employer]  Tobacco Use  . Smoking status: Never Smoker  . Smokeless tobacco: Never Used  Substance and Sexual Activity  . Alcohol use: No  . Drug use: No  . Sexual activity: Not on file  Other Topics Concern  . Not on file  Social History Narrative  . Not on file   Social Determinants of Health   Financial Resource Strain:   . Difficulty of Paying Living Expenses: Not on file  Food Insecurity:   . Worried About Charity fundraiser in the Last Year: Not on file  . Ran Out of Food in the Last Year: Not on file  Transportation Needs:   . Lack of Transportation (Medical): Not on file  . Lack of Transportation (Non-Medical): Not on file    Physical Activity:   . Days of Exercise per Week: Not on file  . Minutes of Exercise per Session: Not on file  Stress:   . Feeling of Stress :  Not on file  Social Connections:   . Frequency of Communication with Friends and Family: Not on file  . Frequency of Social Gatherings with Friends and Family: Not on file  . Attends Religious Services: Not on file  . Active Member of Clubs or Organizations: Not on file  . Attends Archivist Meetings: Not on file  . Marital Status: Not on file  Intimate Partner Violence:   . Fear of Current or Ex-Partner: Not on file  . Emotionally Abused: Not on file  . Physically Abused: Not on file  . Sexually Abused: Not on file    Review of Systems:    Constitutional: No weight loss, fever or chills Skin: No rash  Cardiovascular: No chest pain Respiratory: No SOB Gastrointestinal: See HPI and otherwise negative Genitourinary: No dysuria  Neurological: No headache Musculoskeletal: No new muscle or joint pain Hematologic: No bruising Psychiatric: No history of depression or anxiety   Physical Exam:  Vital signs: BP (!) 142/70 (BP Location: Left Arm, Patient Position: Sitting, Cuff Size: Normal)   Pulse 69   Temp 97.7 F (36.5 C)   Ht 6' 2.5" (1.892 m)   Wt 192 lb 4 oz (87.2 kg)   SpO2 96%   BMI 24.35 kg/m   Constitutional:   Pleasant Caucasian male appears to be in NAD, Well developed, Well nourished, alert and cooperative Head:  Normocephalic and atraumatic. Eyes:   PEERL, EOMI. No icterus. Conjunctiva pink. Ears:  Normal auditory acuity. Neck:  Supple Throat: Oral cavity and pharynx without inflammation, swelling or lesion.  Respiratory: Respirations even and unlabored. Lungs clear to auscultation bilaterally.   No wheezes, crackles, or rhonchi.  Cardiovascular: Normal S1, S2. No MRG. Regular rate and rhythm. No peripheral edema, cyanosis or pallor.  Gastrointestinal:  Soft, nondistended, nontender. No rebound or guarding.  Normal bowel sounds. No appreciable masses or hepatomegaly. Rectal:  Not performed.  Msk:  Symmetrical without gross deformities. Without edema, no deformity or joint abnormality.  Neurologic:  Alert and  oriented x4;  grossly normal neurologically.  Skin:   Dry and intact without significant lesions or rashes. Psychiatric: Demonstrates good judgement and reason without abnormal affect or behaviors.  MOST RECENT LABS AND IMAGING: CBC    Component Value Date/Time   WBC 8.6 05/10/2019 1540   RBC 4.34 05/10/2019 1540   HGB 14.3 05/10/2019 1540   HCT 43.2 05/10/2019 1540   PLT 153 05/10/2019 1540   MCV 99.5 05/10/2019 1540   MCH 32.9 05/10/2019 1540   MCHC 33.1 05/10/2019 1540   RDW 13.1 05/10/2019 1540   LYMPHSABS 1.2 12/27/2012 0921   MONOABS 0.7 12/27/2012 0921   EOSABS 0.1 12/27/2012 0921   BASOSABS 0.0 12/27/2012 0921    CMP     Component Value Date/Time   NA 138 05/10/2019 1540   K 4.1 05/10/2019 1540   CL 105 05/10/2019 1540   CO2 25 05/10/2019 1540   GLUCOSE 145 (H) 05/10/2019 1540   BUN 18 05/10/2019 1540   CREATININE 0.74 05/10/2019 1540   CALCIUM 9.5 05/10/2019 1540   PROT 7.6 05/10/2019 1540   ALBUMIN 4.6 05/10/2019 1540   AST 27 05/10/2019 1540   ALT 29 05/10/2019 1540   ALKPHOS 47 05/10/2019 1540   BILITOT 0.7 05/10/2019 1540   GFRNONAA >60 05/10/2019 1540   GFRAA >60 05/10/2019 1540    Assessment: 1.  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  Discussed ischemic colitis including pathophysiology and answered questions.  Told the patient typically this is self-limited and hopefully will have no further problems with this.  If he does have a similar instance recur then we may want to get a CT angiography. 2.  Reviewed patient's meds, other than his Doxazosin there are no other  medicines which could make him prone to having another episode.  Did discuss this with the patient. 3.  Patient is up-to-date with his colonoscopies, reviewed his last one, repeat was recommended in 5 years which would be 2023. 4.  Briefly discussed patient's abdominal cramping and his use, maybe this was IBS.  Those symptoms are not chronic for him now though. 5.  Told the patient to call us if he is any further bleeding or problems.  Otherwise he will follow-up as needed with Dr. Fuller Plan or myself.  Ellouise Newer, PA-C [facility] 06/30/2019, 2:01 PM  Cc: Velna Hatchet, MD

## 2019-06-30 NOTE — Patient Instructions (Signed)
If you are age 67 or older, your body mass index should be between 23-30. Your Body mass index is 24.35 kg/m. If this is out of the aforementioned range listed, please consider follow up with your Primary Care Provider.  If you are age 107 or younger, your body mass index should be between 19-25. Your Body mass index is 24.35 kg/m. If this is out of the aformentioned range listed, please consider follow up with your Primary Care Provider.    Ischemic Colitis  Ischemic colitis is damage to the large intestine due to reduced blood flow (ischemia) to the colon. The colon is the last section of the large intestine, where stool is formed. The reduced blood flow may lead to the death of cells (necrosis) in the lining of the colon, damaging the colon and often causing bleeding. Most cases of ischemic colitis clear up in a few days with treatment. In other cases, blood flow does not improve, and parts of the colon start to die. This is extremely serious and even life-threatening. If this happens, surgery may be required. In some cases, parts of the colon may need to be removed. What are the causes? Ischemic colitis results from a decrease in the blood supply to the colon. Many conditions can cause this, such as:  Heart problems that reduce blood flow to the arteries that supply the colon. These include problems such as coronary heart disease, peripheral vascular disease, atrial fibrillation, and congestive heart failure.  Low blood pressure from: ? An infection that spreads to the blood (sepsis). ? Dehydration or bleeding (shock).  Drugs that narrow blood vessels (vasoconstrictors). Sometimes the cause is not known. What increases the risk? You are more likely to develop this condition if:  You are 85 years of age or older.  You are male.  You have another medical condition, such as: ? Heart disease. ? Diabetes. ? Kidney disease that requires you to be on dialysis. ? A disease that causes  blood clots.  You are frequently constipated.  You have had surgery on the heart, blood vessels (such as the aorta), or colon.  You take certain medicines or drugs, such as: ? Medicines that suppress your immune system (immunomodulators). ? Medicines that cause constipation. ? Illegal drugs, such as cocaine or methamphetamines.  You get an extreme amount of exercise from long-distance bike riding or running. What are the signs or symptoms? Symptoms of this condition start suddenly and may include:  Dull pain, usually on the left side of the abdomen.  Tenderness of the abdomen.  Abdomen (abdominal) cramps.  An urgent need to have a bowel movement.  Loose, bloody stools with clots of dark or bright red blood.  Nausea and vomiting.  Fever.  Weakness, fatigue, and confusion. How is this diagnosed? This condition may be diagnosed based on:  Your symptoms, your medical history, and a physical exam.  Tests to find out more about your condition and to rule out other causes of pain and bleeding. These tests may include: ? Blood tests to check for clotting, blood loss, and low proteins in your blood. ? CT scan of the colon. ? A procedure to examine the inside of your colon using a scope that is passed through the rectum (colonoscopy). Colonoscopy is the most important diagnostic test. During this test, your health care provider may take a small piece of tissue from your colon to be examined under a microscope (biopsy). How is this treated? You may be hospitalized for treatment.  Treatment usually includes:  Not eating or drinking anything. This allows the colon to rest.  IV fluids to maintain blood pressure, regulate blood minerals (electrolytes), and provide nutrition.  Having a tube inserted into your stomach through your nose (nasogastric tube) to drain your stomach.  IV antibiotic medicines. These may be used if an infection is suspected.  Stopping or changing medicines  that may be causing the condition. You may need surgery if your condition is severe or if it gets worse or does not get better after a few days. Parts of the colon that will not recover may need to be removed. In some cases, a procedure is also done to attach the healthy part of the colon to the outer wall of the abdomen to drain stool (colostomy). Follow these instructions at home:  Follow instructions from your health care provider about eating or drinking restrictions.  Drink enough fluid to keep your urine clear or pale yellow.  Take over-the-counter and prescription medicines only as told by your health care provider.  Return to your normal activities as told by your health care provider. Ask your health care provider what activities are safe for you.  Do not use any products that contain nicotine or tobacco, such as cigarettes and e-cigarettes. If you need help quitting, ask your health care provider.  Keep all follow-up visits as told by your health care provider. This is important. Contact a health care provider if:  You have blood in your stool.  You have abdominal pain or cramps.  You have constipation.  You have nausea or vomiting. Get help right away if:  You have a moderate to large amount of loose, bloody stools with clots of dark or bright red blood.  You have severe abdominal pain.  Your abdominal pain has not improved after 24 hours.  You have a fever.  You have not been able to have a bowel movement, and you are in pain and vomiting.  You have shortness of breath.  You are very tired (lethargic) or have confusion. Summary  Ischemic colitis is damage to the large intestine due to reduced blood flow (ischemia) to the colon.  Some of the symptoms of this condition include abdominal pain or tenderness, bloody stools, and an urgent need to have a bowel movement.  Diagnosis usually includes a procedure to examine the inside of the colon using a scope that is  passed through the rectum (colonoscopy). This information is not intended to replace advice given to you by your health care provider. Make sure you discuss any questions you have with your health care provider. Document Revised: 08/02/2018 Document Reviewed: 05/29/2016 Elsevier Patient Education  [place].

## 2019-07-01 NOTE — Progress Notes (Signed)
Reviewed and agree with management plan.  Lerlene Treadwell T. Jaselyn Nahm, MD FACG [facility]  

## 2019-07-04 ENCOUNTER — Other Ambulatory Visit (HOSPITAL_COMMUNITY)
Admission: RE | Admit: 2019-07-04 | Discharge: 2019-07-04 | Disposition: A | Discharge status: Home / Self Care | Payer: Medicare Other | Source: Ambulatory Visit | Attending: Internal Medicine | Admitting: Internal Medicine

## 2019-07-04 DIAGNOSIS — Z01812 Encounter for preprocedural laboratory examination: Secondary | ICD-10-CM | POA: Diagnosis not present

## 2019-07-04 DIAGNOSIS — Z20822 Contact with and (suspected) exposure to covid-19: Secondary | ICD-10-CM | POA: Diagnosis not present

## 2019-07-04 LAB — SARS CORONAVIRUS 2 (TAT 6-24 HRS): SARS Coronavirus 2: NEGATIVE

## 2019-07-08 ENCOUNTER — Ambulatory Visit (INDEPENDENT_AMBULATORY_CARE_PROVIDER_SITE_OTHER): Payer: Medicare Other | Admitting: Internal Medicine

## 2019-07-08 ENCOUNTER — Ambulatory Visit: Payer: Medicare Other | Admitting: Pulmonary Disease

## 2019-07-08 ENCOUNTER — Ambulatory Visit: Payer: Medicare Other | Admitting: Primary Care

## 2019-07-08 ENCOUNTER — Other Ambulatory Visit: Payer: Self-pay

## 2019-07-08 ENCOUNTER — Encounter: Payer: Self-pay | Admitting: Primary Care

## 2019-07-08 DIAGNOSIS — R059 Cough, unspecified: Secondary | ICD-10-CM

## 2019-07-08 DIAGNOSIS — R0602 Shortness of breath: Secondary | ICD-10-CM | POA: Diagnosis not present

## 2019-07-08 DIAGNOSIS — R918 Other nonspecific abnormal finding of lung field: Secondary | ICD-10-CM

## 2019-07-08 DIAGNOSIS — R0609 Other forms of dyspnea: Secondary | ICD-10-CM

## 2019-07-08 DIAGNOSIS — R05 Cough: Secondary | ICD-10-CM | POA: Diagnosis not present

## 2019-07-08 DIAGNOSIS — R0600 Dyspnea, unspecified: Secondary | ICD-10-CM | POA: Diagnosis not present

## 2019-07-08 LAB — PULMONARY FUNCTION TEST
DL/VA % pred: 106 %
DL/VA: 4.26 ml/min/mmHg/L
DLCO cor % pred: 93 %
DLCO cor: 28.72 ml/min/mmHg
DLCO unc % pred: 93 %
DLCO unc: 28.72 ml/min/mmHg
FEF 25-75 Post: 3.63 L/sec
FEF 25-75 Pre: 3.66 L/sec
FEF2575-%Change-Post: 0 %
FEF2575-%Pred-Post: 116 %
FEF2575-%Pred-Pre: 117 %
FEV1-%Change-Post: 1 %
FEV1-%Pred-Post: 100 %
FEV1-%Pred-Pre: 99 %
FEV1-Post: 4.06 L
FEV1-Pre: 3.99 L
FEV1FVC-%Change-Post: 0 %
FEV1FVC-%Pred-Pre: 104 %
FEV6-%Change-Post: 2 %
FEV6-%Pred-Post: 101 %
FEV6-%Pred-Pre: 98 %
FEV6-Post: 5.22 L
FEV6-Pre: 5.08 L
FEV6FVC-%Change-Post: 0 %
FEV6FVC-%Pred-Post: 105 %
FEV6FVC-%Pred-Pre: 104 %
FVC-%Change-Post: 1 %
FVC-%Pred-Post: 96 %
FVC-%Pred-Pre: 95 %
FVC-Post: 5.22 L
FVC-Pre: 5.16 L
Post FEV1/FVC ratio: 78 %
Post FEV6/FVC ratio: 100 %
Pre FEV1/FVC ratio: 77 %
Pre FEV6/FVC Ratio: 99 %
RV % pred: 100 %
RV: 2.68 L
TLC % pred: 93 %
TLC: 7.53 L

## 2019-07-08 NOTE — Patient Instructions (Signed)
Pulmonary function testing normal today; no evidence of obstructive lung disease or asthma   Follow-up: - Return as needed if breathing symptoms or cough worsen. If you develop purulent or blood in mucus or weight loss  - PCP annually   COVID-19 Vaccine Information can be found at: ShippingScam.co.uk For questions related to vaccine distribution or appointments, please email [email or web address] or call [phone].

## 2019-07-08 NOTE — Progress Notes (Signed)
@Patient  ID: Mike Stephens, male    DOB: [birth year], 69 y.o.   MRN: [id number]  Chief Complaint  Patient presents with  . Follow-up    F/U on PFT. States his breathing has been great since last visit. Denies any SOB. Still has a cough.     Referring provider: Velna Hatchet, MD  HPI: 69 year old male, never smoked. PMH significant for OSA, dyspnea, hyperlipidemia, diverticulosis, hearing loss. Patient of Dr. Chase Caller, last seen by NP on 05/15/19. HRCT in January 2021 showed no evidence of ILD. Eosinophils absolute 100 in 2014.   07/08/2019 Patient presents today for PFTs and 6 week follow-up. He is feeling well, no acute complaints. States that his breathing is better. He's always had a chronic cough, which he reports comes and goes at different times.  He appears to be more susceptible to bronchitis symptoms which typical occur seasonally and last 1-2 months. He follows with ENT for his hearing.   Significant testing reviewed: PFTs 07/08/2019- FVC 5.22 (96), FEV1 4.06 (100), ratio 78, DLCO 28.72 (93%) Normal spirometry, no evidence of restriction or obstruction. No BD response. Normal diffusion capacity.   05/06/2019-CT chest high-res-no evidence of interstitial lung disease, no evidence of asbestos related pleural disease, scattered small solid pulmonary nodules largest being 4 mm, no follow-up needed if patient is low risk, one-vessel coronary arthrosclerosis  08/17/2015-CT maxillofacial-paranasal sinuses clear aside from minimal mucosal edema at the ostium of the maxillary sinus bilaterally  No Known Allergies  Immunization History  Administered Date(s) Administered  . Hepatitis A 01/03/2005, 07/21/2005  . Hepatitis B 01/03/2005, 02/07/2005, 07/03/2005  . Influenza, High Dose Seasonal PF 03/06/2019  . Td 10/31/2004  . Zoster 12/22/2010    Past Medical History:  Diagnosis Date  . Benign prostatic hypertrophy   . Diverticulosis   . Hearing loss in left ear   . Heart  murmur   . History of mitral valve prolapse    ( clinically NO Mitral regurgitation)   . Hyperlipidemia   . Hypertension   . Hypothyroidism   . OSA (obstructive sleep apnea)    [facility]  . Sleep apnea    no CPAP used in "Awhile" due to collapsed eardrum  . Tubular adenoma of colon 01/2008   Dr Fuller Plan    Tobacco History: Social History   Tobacco Use  Smoking Status Never Smoker  Smokeless Tobacco Never Used   Counseling given: Not Answered   Outpatient Medications Prior to Visit  Medication Sig Dispense Refill  . aspirin 81 MG tablet Take 81 mg by mouth daily.    . Calcium Citrate 200 MG TABS Take 400 mg by mouth daily.    . Cholecalciferol (VITAMIN D3) 2000 UNITS TABS Take 1 tablet by mouth daily.     . Coenzyme Q10 (COQ10) 100 MG CAPS Take 1 capsule by mouth daily.     Marland Kitchen doxazosin (CARDURA) 8 MG tablet TAKE ONE-FOURTH TABLET BY MOUTH EVERY DAY 90 tablet 0  . FIBER COMPLETE PO Take 1 tablet by mouth daily.     Marland Kitchen levothyroxine (SYNTHROID, LEVOTHROID) 25 MCG tablet TAKE ONE AND ONE-HALF TABLETS BY MOUTH ONCE DAILY,EXCEPT TAKE 1 TABLET BY MOUTH ON WEDNESDAY 120 tablet 0  . Multiple Vitamin (MULTIVITAMIN) tablet Take 1 tablet by mouth daily.      . Omega-3 Fatty Acids (FISH OIL) 1000 MG CAPS Take 1 capsule by mouth daily.    . pravastatin (PRAVACHOL) 40 MG tablet Take 1 tablet (40 mg total) by  mouth daily. 90 tablet 3   Facility-Administered Medications Prior to Visit  Medication Dose Route Frequency Provider Last Rate Last Admin  . 0.9 %  sodium chloride infusion  500 mL Intravenous Continuous Ladene Artist, MD       Review of Systems  Review of Systems  Constitutional: Negative.   HENT: Negative for trouble swallowing and voice change.   Respiratory: Positive for cough. Negative for shortness of breath and wheezing.   Cardiovascular: Negative.     Physical Exam  BP 120/68 (BP Location: Left Arm, Patient Position: Sitting, Cuff Size: Normal)   Pulse  63   Temp (!) 97.1 F (36.2 C) (Temporal)   Ht 6\' 3"  (1.905 m)   Wt 192 lb (87.1 kg)   SpO2 98% Comment: on RA  BMI 24.00 kg/m  Physical Exam Constitutional:      Appearance: Normal appearance.  HENT:     Head: Normocephalic and atraumatic.     Mouth/Throat:     Mouth: Mucous membranes are moist.     Pharynx: Oropharynx is clear.  Cardiovascular:     Rate and Rhythm: Normal rate and regular rhythm.  Pulmonary:     Effort: Pulmonary effort is normal.     Breath sounds: Normal breath sounds.  Skin:    General: Skin is warm and dry.  Neurological:     General: No focal deficit present.     Mental Status: He is alert and oriented to person, place, and time. Mental status is at baseline.  Psychiatric:        Mood and Affect: Mood normal.        Behavior: Behavior normal.        Thought Content: Thought content normal.        Judgment: Judgment normal.      Lab Results:  CBC    Component Value Date/Time   WBC 8.6 05/10/2019 1540   RBC 4.34 05/10/2019 1540   HGB 14.3 05/10/2019 1540   HCT 43.2 05/10/2019 1540   PLT 153 05/10/2019 1540   MCV 99.5 05/10/2019 1540   MCH 32.9 05/10/2019 1540   MCHC 33.1 05/10/2019 1540   RDW 13.1 05/10/2019 1540   LYMPHSABS 1.2 12/27/2012 0921   MONOABS 0.7 12/27/2012 0921   EOSABS 0.1 12/27/2012 0921   BASOSABS 0.0 12/27/2012 0921    BMET    Component Value Date/Time   NA 138 05/10/2019 1540   K 4.1 05/10/2019 1540   CL 105 05/10/2019 1540   CO2 25 05/10/2019 1540   GLUCOSE 145 (H) 05/10/2019 1540   BUN 18 05/10/2019 1540   CREATININE 0.74 05/10/2019 1540   CALCIUM 9.5 05/10/2019 1540   GFRNONAA >60 05/10/2019 1540   GFRAA >60 05/10/2019 1540    BNP No results found for: BNP  ProBNP No results found for: PROBNP  Imaging: No results found.   Assessment & Plan:   Abnormal findings on diagnostic imaging of lung - HRCT January 2021 showed no evidence of ILD, no evidence of asbestos related pleural disease, scattered  small solid pulmonary nodules largest being 4 mm, no follow-up needed d/t patient being low risk    Shortness of breath - Reports improvement in breathing, no significant dyspnea noted - PFTs reassuring; no evidence of restrictive/obstructive lung disease. Normal diffusion capacity   Cough - Patient has had an intermittent chronic cough for the past 20 years; symptoms most consistent with Upper airway cough syndrome - Advise oral antihistamine/nasal steriod seasonally  - Recommend  following up with ENT whom he is established with   Follow-up as needed  Martyn Ehrich, NP 07/10/2019

## 2019-07-08 NOTE — Progress Notes (Signed)
Full PFT performed today. °

## 2019-07-10 DIAGNOSIS — R05 Cough: Secondary | ICD-10-CM | POA: Insufficient documentation

## 2019-07-10 DIAGNOSIS — R059 Cough, unspecified: Secondary | ICD-10-CM | POA: Insufficient documentation

## 2019-07-10 NOTE — Assessment & Plan Note (Addendum)
-   HRCT January 2021 showed no evidence of ILD, no evidence of asbestos related pleural disease, scattered small solid pulmonary nodules largest being 4 mm, no follow-up needed d/t patient being low risk

## 2019-07-10 NOTE — Assessment & Plan Note (Addendum)
-   Reports improvement in breathing, no significant dyspnea noted - PFTs reassuring; no evidence of restrictive/obstructive lung disease. Normal diffusion capacity

## 2019-07-10 NOTE — Assessment & Plan Note (Signed)
-   Patient has had an intermittent chronic cough for the past 20 years; symptoms most consistent with Upper airway cough syndrome - Advise oral antihistamine/nasal steriod seasonally  - Recommend following up with ENT whom he is established with

## 2019-07-15 ENCOUNTER — Ambulatory Visit: Payer: Medicare Other | Admitting: Gastroenterology

## 2019-08-08 DIAGNOSIS — H90A32 Mixed conductive and sensorineural hearing loss, unilateral, left ear with restricted hearing on the contralateral side: Secondary | ICD-10-CM | POA: Diagnosis not present

## 2019-08-08 DIAGNOSIS — H7291 Unspecified perforation of tympanic membrane, right ear: Secondary | ICD-10-CM | POA: Diagnosis not present

## 2019-08-08 DIAGNOSIS — H95192 Other disorders following mastoidectomy, left ear: Secondary | ICD-10-CM | POA: Diagnosis not present

## 2019-08-08 DIAGNOSIS — H7122 Cholesteatoma of mastoid, left ear: Secondary | ICD-10-CM | POA: Diagnosis not present

## 2019-08-08 DIAGNOSIS — H906 Mixed conductive and sensorineural hearing loss, bilateral: Secondary | ICD-10-CM | POA: Diagnosis not present

## 2019-08-08 DIAGNOSIS — H7201 Central perforation of tympanic membrane, right ear: Secondary | ICD-10-CM | POA: Diagnosis not present

## 2019-08-08 DIAGNOSIS — Z9621 Cochlear implant status: Secondary | ICD-10-CM | POA: Diagnosis not present

## 2019-09-09 DIAGNOSIS — H2513 Age-related nuclear cataract, bilateral: Secondary | ICD-10-CM | POA: Diagnosis not present

## 2019-09-29 ENCOUNTER — Other Ambulatory Visit: Payer: Self-pay | Admitting: Dermatology

## 2019-11-26 DIAGNOSIS — Z0120 Encounter for dental examination and cleaning without abnormal findings: Secondary | ICD-10-CM | POA: Diagnosis not present

## 2020-02-05 DIAGNOSIS — H7102 Cholesteatoma of attic, left ear: Secondary | ICD-10-CM | POA: Diagnosis not present

## 2020-02-05 DIAGNOSIS — H90A21 Sensorineural hearing loss, unilateral, right ear, with restricted hearing on the contralateral side: Secondary | ICD-10-CM | POA: Diagnosis not present

## 2020-02-05 DIAGNOSIS — H7291 Unspecified perforation of tympanic membrane, right ear: Secondary | ICD-10-CM | POA: Diagnosis not present

## 2020-02-05 DIAGNOSIS — H7193 Unspecified cholesteatoma, bilateral: Secondary | ICD-10-CM | POA: Diagnosis not present

## 2020-02-05 DIAGNOSIS — H7201 Central perforation of tympanic membrane, right ear: Secondary | ICD-10-CM | POA: Diagnosis not present

## 2020-02-05 DIAGNOSIS — H90A32 Mixed conductive and sensorineural hearing loss, unilateral, left ear with restricted hearing on the contralateral side: Secondary | ICD-10-CM | POA: Diagnosis not present

## 2020-02-05 DIAGNOSIS — H7111 Cholesteatoma of tympanum, right ear: Secondary | ICD-10-CM | POA: Diagnosis not present

## 2020-02-05 DIAGNOSIS — Z9621 Cochlear implant status: Secondary | ICD-10-CM | POA: Diagnosis not present

## 2020-02-05 DIAGNOSIS — H906 Mixed conductive and sensorineural hearing loss, bilateral: Secondary | ICD-10-CM | POA: Diagnosis not present

## 2020-02-24 DIAGNOSIS — Z20828 Contact with and (suspected) exposure to other viral communicable diseases: Secondary | ICD-10-CM | POA: Diagnosis not present

## 2020-03-16 DIAGNOSIS — R0789 Other chest pain: Secondary | ICD-10-CM | POA: Diagnosis not present

## 2020-03-16 DIAGNOSIS — R079 Chest pain, unspecified: Secondary | ICD-10-CM | POA: Diagnosis not present

## 2020-03-17 ENCOUNTER — Ambulatory Visit
Admission: RE | Admit: 2020-03-17 | Discharge: 2020-03-17 | Disposition: A | Discharge status: Home / Self Care | Payer: Medicare Other | Source: Ambulatory Visit | Attending: Internal Medicine | Admitting: Internal Medicine

## 2020-03-17 ENCOUNTER — Other Ambulatory Visit: Payer: Self-pay | Admitting: Internal Medicine

## 2020-03-17 ENCOUNTER — Other Ambulatory Visit: Payer: Self-pay

## 2020-03-17 DIAGNOSIS — R0789 Other chest pain: Secondary | ICD-10-CM

## 2020-03-17 DIAGNOSIS — R7989 Other specified abnormal findings of blood chemistry: Secondary | ICD-10-CM

## 2020-03-17 DIAGNOSIS — R911 Solitary pulmonary nodule: Secondary | ICD-10-CM | POA: Diagnosis not present

## 2020-03-17 DIAGNOSIS — J984 Other disorders of lung: Secondary | ICD-10-CM | POA: Diagnosis not present

## 2020-03-17 MED ORDER — IOPAMIDOL (ISOVUE-370) INJECTION 76%
75.0000 mL | Freq: Once | INTRAVENOUS | Status: AC | PRN
  Administered 2020-03-17: 75 mL via INTRAVENOUS

## 2020-03-22 ENCOUNTER — Other Ambulatory Visit: Payer: Self-pay

## 2020-03-22 ENCOUNTER — Encounter: Payer: Self-pay | Admitting: Pulmonary Disease

## 2020-03-22 ENCOUNTER — Ambulatory Visit: Payer: Medicare Other | Admitting: Pulmonary Disease

## 2020-03-22 VITALS — BP 142/68 | HR 72 | Temp 97.5°F | Ht 74.0 in | Wt 193.2 lb

## 2020-03-22 DIAGNOSIS — R918 Other nonspecific abnormal finding of lung field: Secondary | ICD-10-CM

## 2020-03-22 DIAGNOSIS — R0602 Shortness of breath: Secondary | ICD-10-CM | POA: Diagnosis not present

## 2020-03-22 DIAGNOSIS — G4733 Obstructive sleep apnea (adult) (pediatric): Secondary | ICD-10-CM

## 2020-03-22 NOTE — Progress Notes (Signed)
@Patient  ID: Mike Stephens, male    DOB: 09-Aug-1950, 69 y.o.   MRN: [id number]  Chief Complaint  Patient presents with  . Follow-up    sob with exertion,right-sided chest pain with movement x 2 wks., cough-clear    Referring provider: Velna Hatchet, MD  HPI:  69 year old male never smoker reconsulted with our practice on 03/17/2019 for dyspnea on exertion  Past medical history: Obstructive sleep apnea, hyperlipidemia, diverticulosis Smoking history: Never Smoker  Maintenance: None Patient of Dr. Chase Caller  03/22/2020  - Visit   69 year old male never smoker followed in our office by Dr. Chase Caller for dyspnea on exertion.  Patient was last seen in our office in March/2021 by EW NP.  Plan of care from that office visit was as follows: Reviewed high-resolution CT chest that showed no evidence of ILD or spaces related pleural disease.  Some small scattered solid pulmonary nodule.  Pulmonary function testing was reviewed that showed no evidence of restriction or obstructive lung disease.  He was encouraged to remain on oral antihistamine as well as nasal steroid seasonally for his upper airway cough syndrome and continue follow-up with the ENT.  Patient was encouraged and scheduled to have this follow-up visit today at the request of primary care provider Dr. Ardeth Perfect.  Patient completed a CT angio of his chest on 03/17/2020.  She had a new 12 x 9 mm subpleural nodule with irregular margins in the right upper lobe.  Is unsure if this represents focal inflammation and possible neoplasm.  Recommending follow-up chest CT in 3 to 4 weeks to ensure stability or resolution.  Patient reporting today that he is on ongoing shortness of breath with exertion and right-sided chest pain with movement for the last 2 weeks. He has an ongoing cough which is clear.  Reviewed office notes from primary care in [facility] Dr. Ardeth Perfect. Patient was seen on 03/16/2020. This was a work in  visit. Reporting pain when breathing as well as increased lung pain ever since traveling back from [place]. D-dimer was elevated at that time at 0.66. Given patient's age this is actually considered a normal result.  Patient reports that he has sternal chest pain whenever he takes a deep breath in.  He reports this initially happened when he was in [place] then it went away.  Then a few days later he was raking leaves and the symptoms came back.  This is what prompted the visit with primary care.  He still occasionally has this pain especially when taking a full deep breath.  No significant family history of interstitial lung disease or connective tissue disorders.  Father did have tuberculosis is a young adult and did require pneumonectomy.  Mother did die of lung cancer and she was a never smoker.  Patient presents today with his spouse with multiple questions regarding follow-up and work-up.  We will discuss this today.  Patient reports he never developed a fever or had any sort of productive cough.  He was never treated with antibiotics.  Patient was walked in office today was able to complete 3 laps of the desaturations or tachycardia.  Questionaires / Pulmonary Flowsheets:   ACT:  No flowsheet data found.  MMRC: No flowsheet data found.  Epworth:  No flowsheet data found.  Tests:   05/06/2019-CT chest high-res-no evidence of interstitial lung disease, no evidence of asbestos related pleural disease, scattered small solid pulmonary nodules largest being 4 mm, no follow-up needed if patient is low risk,  one-vessel coronary arthrosclerosis  08/17/2015-CT maxillofacial-paranasal sinuses clear aside from minimal mucosal edema at the ostium of the maxillary sinus bilaterally  FENO:  No results found for: NITRICOXIDE  PFT: PFT Results Latest Ref Rng & Units 07/08/2019  FVC-Pre L 5.16  FVC-Predicted Pre % 95  FVC-Post L 5.22  FVC-Predicted Post % 96  Pre FEV1/FVC % %  77  Post FEV1/FCV % % 78  FEV1-Pre L 3.99  FEV1-Predicted Pre % 99  FEV1-Post L 4.06  DLCO uncorrected ml/min/mmHg 28.72  DLCO UNC% % 93  DLCO corrected ml/min/mmHg 28.72  DLCO COR %Predicted % 93  DLVA Predicted % 106  TLC L 7.53  TLC % Predicted % 93  RV % Predicted % 100    WALK:  SIX MIN WALK 03/22/2020 03/17/2019  Supplimental Oxygen during Test? (L/min) No No  Tech Comments: Completed 2 laps without complaints. Pt walked at an average pace completing all required laps and had complaints of very mild SOB.    Imaging: CT ANGIO CHEST PE W OR WO CONTRAST  Result Date: 03/17/2020 CLINICAL DATA:  Chest pain. EXAM: CT ANGIOGRAPHY CHEST WITH CONTRAST TECHNIQUE: Multidetector CT imaging of the chest was performed using the standard protocol during bolus administration of intravenous contrast. Multiplanar CT image reconstructions and MIPs were obtained to evaluate the vascular anatomy. CONTRAST:  36mL ISOVUE-370 IOPAMIDOL (ISOVUE-370) INJECTION 76% COMPARISON:  May 06, 2019. FINDINGS: Cardiovascular: Satisfactory opacification of the pulmonary arteries to the segmental level. No evidence of pulmonary embolism. Normal heart size. No pericardial effusion. Mediastinum/Nodes: Calcified right hilar and subcarinal adenopathy is noted consistent with chronic granulomatous disease. Thyroid gland and esophagus are unremarkable. Lungs/Pleura: No pneumothorax or pleural effusion is noted. Mild biapical scarring is noted. New 12 x 9 mm subpleural nodule is noted with irregular margins in the right upper lobe best seen on image number 42 of series 7. Stable irregular density with calcifications seen in right posterior costophrenic sulcus consistent with scarring or calcified granuloma. Upper Abdomen: No acute abnormality. Musculoskeletal: No chest wall abnormality. No acute or significant osseous findings. Review of the MIP images confirms the above findings. IMPRESSION: 1. No definite evidence of  pulmonary embolus. 2. New 12 x 9 mm subpleural nodule is noted with irregular margins in the right upper lobe. It is uncertain if this represents focal inflammation or possibly neoplasm. Follow-up unenhanced chest CT in 3-4 weeks is recommended to ensure stability or resolution. 3. Calcified right hilar and subcarinal adenopathy is noted consistent with chronic granulomatous disease. Electronically Signed   By: Marijo Conception M.D.   On: 03/17/2020 14:13    Lab Results:  CBC    Component Value Date/Time   WBC 8.6 05/10/2019 1540   RBC 4.34 05/10/2019 1540   HGB 14.3 05/10/2019 1540   HCT 43.2 05/10/2019 1540   PLT 153 05/10/2019 1540   MCV 99.5 05/10/2019 1540   MCH 32.9 05/10/2019 1540   MCHC 33.1 05/10/2019 1540   RDW 13.1 05/10/2019 1540   LYMPHSABS 1.2 12/27/2012 0921   MONOABS 0.7 12/27/2012 0921   EOSABS 0.1 12/27/2012 0921   BASOSABS 0.0 12/27/2012 0921    BMET    Component Value Date/Time   NA 138 05/10/2019 1540   K 4.1 05/10/2019 1540   CL 105 05/10/2019 1540   CO2 25 05/10/2019 1540   GLUCOSE 145 (H) 05/10/2019 1540   BUN 18 05/10/2019 1540   CREATININE 0.74 05/10/2019 1540   CALCIUM 9.5 05/10/2019 1540   GFRNONAA >60 05/10/2019  [place] 05/10/2019 1540    BNP No results found for: BNP  ProBNP No results found for: PROBNP  Specialty Problems      Pulmonary Problems   OSA (obstructive sleep apnea)    NPSG 2006:  AHI 25/hr. He declined CPAP in 2006. Started cpap summer 2014 [place] 11/2012:  Good control on cpap 10cm      Shortness of breath   Cough      No Known Allergies  Immunization History  Administered Date(s) Administered  . Hepatitis A 01/03/2005, 07/21/2005  . Hepatitis B 01/03/2005, 02/07/2005, 07/03/2005  . Influenza Split 02/20/2020  . Influenza, High Dose Seasonal PF 03/06/2019  . Td 10/31/2004  . Zoster 12/22/2010    Past Medical History:  Diagnosis Date  . Benign prostatic hypertrophy   . Diverticulosis   .  Hearing loss in left ear   . Heart murmur   . History of mitral valve prolapse    ( clinically NO Mitral regurgitation)   . Hyperlipidemia   . Hypertension   . Hypothyroidism   . OSA (obstructive sleep apnea)    [facility]  . Sleep apnea    no CPAP used in "Awhile" due to collapsed eardrum  . Tubular adenoma of colon 01/2008   Dr Fuller Plan    Tobacco History: Social History   Tobacco Use  Smoking Status Never Smoker  Smokeless Tobacco Never Used   Counseling given: Yes   Continue to not smoke  Outpatient Encounter Medications as of 03/22/2020  Medication Sig  . aspirin 81 MG tablet Take 81 mg by mouth daily.  . Calcium Citrate 200 MG TABS Take 400 mg by mouth daily.  . Cholecalciferol (VITAMIN D3) 2000 UNITS TABS Take 1 tablet by mouth daily.   . Coenzyme Q10 (COQ10) 100 MG CAPS Take 1 capsule by mouth daily.   Marland Kitchen doxazosin (CARDURA) 8 MG tablet TAKE ONE-FOURTH TABLET BY MOUTH EVERY DAY  . FIBER COMPLETE PO Take 1 tablet by mouth daily.   . hydrocortisone 2.5 % ointment APPLY TO AFFECTED AREA AT BEDTIME AS DIRECTED  . levothyroxine (SYNTHROID, LEVOTHROID) 25 MCG tablet TAKE ONE AND ONE-HALF TABLETS BY MOUTH ONCE DAILY,EXCEPT TAKE 1 TABLET BY MOUTH ON WEDNESDAY  . Multiple Vitamin (MULTIVITAMIN) tablet Take 1 tablet by mouth daily.    . Omega-3 Fatty Acids (FISH OIL) 1000 MG CAPS Take 1 capsule by mouth daily.  . pravastatin (PRAVACHOL) 40 MG tablet Take 1 tablet (40 mg total) by mouth daily.   Facility-Administered Encounter Medications as of 03/22/2020  Medication  . 0.9 %  sodium chloride infusion     Review of Systems  Review of Systems  Constitutional: Negative for activity change, chills, fatigue, fever and unexpected weight change.  HENT: Negative for postnasal drip, rhinorrhea, sinus pressure, sinus pain and sore throat.   Eyes: Negative.   Respiratory: Positive for shortness of breath. Negative for cough and wheezing.   Cardiovascular: Negative for  chest pain and palpitations.  Gastrointestinal: Negative for constipation, diarrhea, nausea and vomiting.  Endocrine: Negative.   Genitourinary: Negative.   Musculoskeletal: Negative.   Skin: Negative.   Neurological: Negative for dizziness and headaches.  Psychiatric/Behavioral: Negative.  Negative for dysphoric mood. The patient is not nervous/anxious.   All other systems reviewed and are negative.    Physical Exam  BP (!) 142/68 (BP Location: Right Arm, Cuff Size: Normal)   Pulse 72   Temp (!) 97.5 F (36.4 C) (Temporal)   Ht  6\' 2"  (1.88 m)   Wt 193 lb 3.2 oz (87.6 kg)   SpO2 99%   BMI 24.81 kg/m   Wt Readings from Last 5 Encounters:  03/22/20 193 lb 3.2 oz (87.6 kg)  07/08/19 192 lb (87.1 kg)  06/30/19 192 lb 4 oz (87.2 kg)  03/17/19 184 lb 12.8 oz (83.8 kg)  05/10/16 182 lb (82.6 kg)    BMI Readings from Last 5 Encounters:  03/22/20 24.81 kg/m  07/08/19 24.00 kg/m  06/30/19 24.35 kg/m  03/17/19 23.41 kg/m  05/10/16 23.37 kg/m     Physical Exam Vitals and nursing note reviewed.  Constitutional:      General: He is not in acute distress.    Appearance: Normal appearance. He is normal weight.  HENT:     Head: Normocephalic and atraumatic.     Right Ear: Hearing and external ear normal.     Left Ear: Hearing and external ear normal.     Nose: Nose normal. No mucosal edema or rhinorrhea.     Right Turbinates: Not enlarged.     Left Turbinates: Not enlarged.     Mouth/Throat:     Mouth: Mucous membranes are dry.     Pharynx: Oropharynx is clear. No oropharyngeal exudate.  Eyes:     Pupils: Pupils are equal, round, and reactive to light.  Cardiovascular:     Rate and Rhythm: Normal rate and regular rhythm.     Pulses: Normal pulses.     Heart sounds: Normal heart sounds. No murmur heard.   Pulmonary:     Effort: Pulmonary effort is normal.     Breath sounds: Normal breath sounds. No decreased breath sounds, wheezing or rales.  Chest:     Musculoskeletal:     Cervical back: Normal range of motion.     Right lower leg: No edema.     Left lower leg: No edema.  Lymphadenopathy:     Cervical: No cervical adenopathy.  Skin:    General: Skin is warm and dry.     Capillary Refill: Capillary refill takes less than 2 seconds.     Findings: No erythema or rash.  Neurological:     General: No focal deficit present.     Mental Status: He is alert and oriented to person, place, and time.     Motor: No weakness.     Coordination: Coordination normal.     Gait: Gait is intact. Gait (Walk today in office stable without any oxygen desaturations) normal.  Psychiatric:        Mood and Affect: Mood normal.        Behavior: Behavior normal. Behavior is cooperative.        Thought Content: Thought content normal.        Judgment: Judgment normal.       Assessment & Plan:   OSA (obstructive sleep apnea) Plan: Continue CPAP therapy At next office visit we will need to discuss having the patient establish with a pulmonary sleep MD  Abnormal findings on diagnostic imaging of lung Recent CTA chest that shows right upper lobe pleural nodule Never smoker No significant recent weight loss Stable walk in our office today January/2021 high-resolution CT chest shows no right upper lobe pleural nodule Reviewed January/2021 high-resolution CT chest Reviewed November/2021 CTA chest with patient and spouse  Discussion: We will follow radiology recommendations at this time to repeat CT chest in 3 to 4 weeks.  If nodule is growing in size or persisting will need  to consider pet imaging and/or tissue sampling for diagnosis.  I have reviewed this with the patient and spouse today.  We will also discuss case with Dr. Chase Caller.  May need to consider consult with interventional pulmonologist such as Dr. Valeta Harms or Dr. Lamonte Sakai.  Reviewed in great detail CT imaging as well as work-up as well as potential interventions in plan of care.  All  questions were answered for patient as well as spouse.  They are aware to contact our office if they have additional questions or concerns.  Plan: Obtain CT chest without contrast in 3 to 4 weeks Follow-up in 4 weeks after completing CT chest  Shortness of breath Reviewed pulmonary function testing with patient from March/2021 Walk today in office stable Unsure what the cause may be of patient's substernal chest pain with deep inspiration  Plan: We will continue to clinically monitor    Return in about 4 weeks (around 04/19/2020), or if symptoms worsen or fail to improve, for Follow up with Dr. Lamonte Sakai, Follow up with Dr. Valeta Harms, Follow up with Dr. Purnell Shoemaker, After Chest CT.   Lauraine Rinne, NP 03/22/2020   This appointment required 75 minutes of patient care (this includes precharting, chart review, review of results, face-to-face care, etc.).

## 2020-03-22 NOTE — Assessment & Plan Note (Signed)
Plan: Continue CPAP therapy At next office visit we will need to discuss having the patient establish with a pulmonary sleep MD

## 2020-03-22 NOTE — Assessment & Plan Note (Signed)
Reviewed pulmonary function testing with patient from March/2021 Walk today in office stable Unsure what the cause may be of patient's substernal chest pain with deep inspiration  Plan: We will continue to clinically monitor

## 2020-03-22 NOTE — Assessment & Plan Note (Addendum)
Recent CTA chest that shows right upper lobe pleural nodule Never smoker No significant recent weight loss Stable walk in our office today January/2021 high-resolution CT chest shows no right upper lobe pleural nodule Reviewed January/2021 high-resolution CT chest Reviewed November/2021 CTA chest with patient and spouse  Discussion: We will follow radiology recommendations at this time to repeat CT chest in 3 to 4 weeks.  If nodule is growing in size or persisting will need to consider pet imaging and/or tissue sampling for diagnosis.  I have reviewed this with the patient and spouse today.  We will also discuss case with Dr. Chase Caller.  May need to consider consult with interventional pulmonologist such as Dr. Valeta Harms or Dr. Lamonte Sakai.  Reviewed in great detail CT imaging as well as work-up as well as potential interventions in plan of care.  All questions were answered for patient as well as spouse.  They are aware to contact our office if they have additional questions or concerns.  Plan: Obtain CT chest without contrast in 3 to 4 weeks Follow-up in 4 weeks after completing CT chest

## 2020-03-22 NOTE — Patient Instructions (Addendum)
You were seen today by Lauraine Rinne, NP  for:   1. Abnormal findings on diagnostic imaging of lung  - CT Chest Wo Contrast; Future  As discussed today we will repeat a CT of your chest in 3 to 4 weeks  I will discuss your CT results with Dr. Chase Caller.  May also need to consult our interventional pulmonary team Dr. Lamonte Sakai or Dr. Valeta Harms.  I will be in contact with you after speaking with Dr. Chase Caller  2. Shortness of breath  Walk today in office   We recommend today:  Orders Placed This Encounter  Procedures  . CT Chest Wo Contrast    Standing Status:   Future    Standing Expiration Date:   03/22/2021    Scheduling Instructions:     3-4 weeks from 03/17/20 CT Angio    Order Specific Question:   Preferred imaging location?    Answer:   [place]   Orders Placed This Encounter  Procedures  . CT Chest Wo Contrast   No orders of the defined types were placed in this encounter.   Follow Up:    Return in about 4 weeks (around 04/19/2020), or if symptoms worsen or fail to improve, for Follow up with Dr. Lamonte Sakai, Follow up with Dr. Valeta Harms, Follow up with Dr. Purnell Shoemaker, After Chest CT.   Notification of test results are managed in the following manner: If there are  any recommendations or changes to the  plan of care discussed in office today,  we will contact you and let you know what they are. If you do not hear from [place], then your results are normal and you can view them through your  MyChart account , or a letter will be sent to you. Thank you again for trusting [place] with your care  - Thank you, [facility]    It is flu season:   >>> Best ways to protect herself from the flu: Receive the yearly flu vaccine, practice good hand hygiene washing with soap and also using hand sanitizer when available, eat a nutritious meals, get adequate rest, hydrate appropriately       Please contact the office if your symptoms worsen or you have concerns that you are not  improving.   Thank you for choosing [facility] for your healthcare, and for allowing [place] to partner with you on your healthcare journey. I am thankful to be able to provide care to you today.   Wyn Quaker FNP-C

## 2020-03-24 ENCOUNTER — Telehealth: Payer: Self-pay | Admitting: Pulmonary Disease

## 2020-03-24 ENCOUNTER — Encounter: Payer: Self-pay | Admitting: Pulmonary Disease

## 2020-03-24 NOTE — Telephone Encounter (Signed)
03/24/20  Spoke with the patient and spouse over the phone.  Reviewed with them.  I spoke with Dr. Chase Caller regarding the case.  He is agreeable to the current plan of care which is repeating CT in 3 to 4 weeks.  Patient is already scheduled to have repeat CT on 04/09/2020.  We will coordinate close follow-up with our office on 04/14/2020 at 11:30am.  Wyn Quaker, FNP

## 2020-04-09 ENCOUNTER — Ambulatory Visit
Admission: RE | Admit: 2020-04-09 | Discharge: 2020-04-09 | Disposition: A | Discharge status: Home / Self Care | Payer: Medicare Other | Source: Ambulatory Visit | Attending: Pulmonary Disease | Admitting: Pulmonary Disease

## 2020-04-09 ENCOUNTER — Other Ambulatory Visit: Payer: Self-pay

## 2020-04-09 DIAGNOSIS — I700 Atherosclerosis of aorta: Secondary | ICD-10-CM | POA: Diagnosis not present

## 2020-04-09 DIAGNOSIS — R918 Other nonspecific abnormal finding of lung field: Secondary | ICD-10-CM

## 2020-04-09 DIAGNOSIS — I2510 Atherosclerotic heart disease of native coronary artery without angina pectoris: Secondary | ICD-10-CM | POA: Diagnosis not present

## 2020-04-09 DIAGNOSIS — J929 Pleural plaque without asbestos: Secondary | ICD-10-CM | POA: Diagnosis not present

## 2020-04-09 DIAGNOSIS — I898 Other specified noninfective disorders of lymphatic vessels and lymph nodes: Secondary | ICD-10-CM | POA: Diagnosis not present

## 2020-04-12 ENCOUNTER — Ambulatory Visit: Payer: Medicare Other | Admitting: Adult Health

## 2020-04-12 ENCOUNTER — Encounter: Payer: Self-pay | Admitting: Adult Health

## 2020-04-12 ENCOUNTER — Other Ambulatory Visit: Payer: Self-pay

## 2020-04-12 VITALS — BP 140/60 | HR 83 | Temp 97.2°F | Ht 74.0 in | Wt 192.2 lb

## 2020-04-12 DIAGNOSIS — R0600 Dyspnea, unspecified: Secondary | ICD-10-CM

## 2020-04-12 DIAGNOSIS — R059 Cough, unspecified: Secondary | ICD-10-CM | POA: Diagnosis not present

## 2020-04-12 DIAGNOSIS — R21 Rash and other nonspecific skin eruption: Secondary | ICD-10-CM | POA: Insufficient documentation

## 2020-04-12 DIAGNOSIS — R0609 Other forms of dyspnea: Secondary | ICD-10-CM

## 2020-04-12 DIAGNOSIS — R918 Other nonspecific abnormal finding of lung field: Secondary | ICD-10-CM | POA: Diagnosis not present

## 2020-04-12 LAB — COMPREHENSIVE METABOLIC PANEL
ALT: 26 U/L (ref 0–53)
AST: 20 U/L (ref 0–37)
Albumin: 4.2 g/dL (ref 3.5–5.2)
Alkaline Phosphatase: 50 U/L (ref 39–117)
BUN: 22 mg/dL (ref 6–23)
CO2: 27 mEq/L (ref 19–32)
Calcium: 9.4 mg/dL (ref 8.4–10.5)
Chloride: 105 mEq/L (ref 96–112)
Creatinine, Ser: 0.88 mg/dL (ref 0.40–1.50)
GFR: 88.78 mL/min (ref >60.00)
Glucose, Bld: 165 mg/dL — ABNORMAL HIGH (ref 70–99)
Potassium: 3.8 mEq/L (ref 3.5–5.1)
Sodium: 140 mEq/L (ref 135–145)
Total Bilirubin: 0.4 mg/dL (ref 0.2–1.2)
Total Protein: 7.4 g/dL (ref 6.0–8.3)

## 2020-04-12 LAB — CBC WITH DIFFERENTIAL/PLATELET
Basophils Absolute: 0 10*3/uL (ref 0.0–0.1)
Basophils Relative: 0.8 % (ref 0.0–3.0)
Eosinophils Absolute: 0.1 10*3/uL (ref 0.0–0.7)
Eosinophils Relative: 3 % (ref 0.0–5.0)
HCT: 38.9 % — ABNORMAL LOW (ref 39.0–52.0)
Hemoglobin: 13.2 g/dL (ref 13.0–17.0)
Lymphocytes Relative: 27 % (ref 12.0–46.0)
Lymphs Abs: 1.1 10*3/uL (ref 0.7–4.0)
MCHC: 34 g/dL (ref 30.0–36.0)
MCV: 95.6 fl (ref 78.0–100.0)
Monocytes Absolute: 0.5 10*3/uL (ref 0.1–1.0)
Monocytes Relative: 11.4 % (ref 3.0–12.0)
Neutro Abs: 2.4 10*3/uL (ref 1.4–7.7)
Neutrophils Relative %: 57.8 % (ref 43.0–77.0)
Platelets: 202 10*3/uL (ref 150.0–400.0)
RBC: 4.06 Mil/uL — ABNORMAL LOW (ref 4.22–5.81)
RDW: 12.7 % (ref 11.5–15.5)
WBC: 4.1 10*3/uL (ref 4.0–10.5)

## 2020-04-12 LAB — SEDIMENTATION RATE: Sed Rate: 35 mm/hr — ABNORMAL HIGH (ref 0–20)

## 2020-04-12 MED ORDER — AZITHROMYCIN 250 MG PO TABS: ORAL_TABLET | ORAL | 0 refills | Status: AC

## 2020-04-12 MED ORDER — PREDNISONE 10 MG PO TABS: ORAL_TABLET | ORAL | 0 refills | Status: DC

## 2020-04-12 NOTE — Assessment & Plan Note (Addendum)
Minimum cough and shortness of breath.  Previous CT showed no evidence of interstitial lung disease.  Pulmonary function testing was normal.  May use over-the-counter cough control products as needed.

## 2020-04-12 NOTE — Patient Instructions (Signed)
Zpack take as directed.  Prednisone taper over next week.  Labs today.  Follow up with Dermatology as discussed.  CT chest w/out contrast in 6 week Follow up in 6 weeks with Dr. Chase Caller or Royal Piedra NP

## 2020-04-12 NOTE — Assessment & Plan Note (Signed)
Skin rash questionable etiology.  Unclear if this is related to current pulmonary problem however of asked him to return back to dermatology for further evaluation and possible lesion sampling.

## 2020-04-12 NOTE — Progress Notes (Signed)
@Patient  ID: Mike Stephens, male    DOB: 27-Jul-1950, 69 y.o.   MRN: [id number]  Chief Complaint  Patient presents with  . Follow-up    Referring provider: Velna Hatchet, MD  HPI: 69 year old male never smoker seen November 2020 for dry cough and  shortness of breath for years . Workup with HRCT Chest negative for ILD, Normal PFT and no exertional desats. . Found to have lung nodules on CT chest   TEST/EVENTS :  HRCT chest January 2021 - for interstitial lung disease.  No evidence of asbestos related pleural disease.  Scattered small solid pulmonary nodules largest 4 mm.  CT chest angio March 17, 2020 - for PE, new 12 x 9 mm subpleural nodule right upper lobe with irregular margins.  Calcified right hilar and subcarinal adenopathy noted consistent with chronic granulomatous disease  CT chest April 09, 2020 no significant interval change in the subpleural nodule in the right upper lobe, 2 new nodules in the right upper lobe measuring 8 mm with irregular margins.  Favored to represent infectious or inflammatory process.  Recommended for a follow-up CT in 3 to 6 months.  PFTs July 08, 2019 FEV1 100%, ratio 78, FVC 96%, no significant bronchodilator response, DLCO 93%. 04/12/2020 Follow up : Lung nodules  Patient returns for a 1 month follow-up.  Patient was seen last visit for an abnormal CT chest with lung nodule.  Patient was seen initially for a pulmonary consult November 2020 for shortness of breath with activity.  Work-up showed a high-resolution CT chest that was negative for interstitial lung disease.  It showed scattered small solid pulmonary nodules largest 4 mm.  Pulmonary function testing was normal.  And walk test in the office showed no hypoxia with ambulation.    Patient had recent trip to [place] , had sudden onset of dyspnea and chest tightness. Seen by PCP, D Dimer was positive . A CT chest was done March 17, 2020 that was negative for PE.  Mild biapical  scarring.  A new 12 x 9 mm subpleural nodule noted with irregular margins in the right upper lobe.  Stable irregular density with calcifications in the right posterior lung consistent with scarring or calcified granuloma. Says that his chest tightness resolved.   A repeat CT chest was done April 09, 2020 that showed no significant interval change in right upper lobe nodule.  Measuring 13 x 7 mm.  Associated pleural thickening and pleural parenchymal scarring.  2 new nodules in the right upper lobe measuring up to 8 mm with irregular margins.  Left lung was clear.  CT impression findings were favored to represent a possible infectious or inflammatory process given the rapid interval development. We discussed his CT results. Minimal dry cough . No Covid infection history . Declines vaccine currently.  15-20 years dry cough and some dyspnea with heavy exercise.  Patient is active does some exercising and walking.  Occasional hiking and biking.  Is able to complete activity but feels more short of breath with heavy activity. No increased cough,congestion,  Has noted rash spots on leg.  Went to the dermatologist and prescribed a cream but rash did not resolve.  It is nonpruritic.  Mild hyper pigmentation. FH + mother died lung cancer - never smoker .  Patient denies any unintentional weight loss.  No hemoptysis.  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  Hepatitis A 01/03/2005, 07/21/2005  . Hepatitis B 01/03/2005, 02/07/2005, 07/03/2005  . Influenza Split 02/20/2020  . Influenza, High Dose Seasonal PF 03/06/2019  . Td 10/31/2004  . Zoster 12/22/2010    Past Medical History:  Diagnosis Date  . Benign prostatic hypertrophy   . Diverticulosis   . Hearing loss in left ear   . Heart murmur   . History of mitral valve prolapse    ( clinically NO Mitral regurgitation)   .  Hyperlipidemia   . Hypertension   . Hypothyroidism   . OSA (obstructive sleep apnea)    [facility]  . Sleep apnea    no CPAP used in "Awhile" due to collapsed eardrum  . Tubular adenoma of colon 01/2008   Dr Fuller Plan    Tobacco History: Social History   Tobacco Use  Smoking Status Never Smoker  Smokeless Tobacco Never Used   Counseling given: Not Answered   Outpatient Medications Prior to Visit  Medication Sig Dispense Refill  . aspirin 81 MG tablet Take 81 mg by mouth daily.    . Calcium Citrate 200 MG TABS Take 400 mg by mouth daily.    . Cholecalciferol (VITAMIN D3) 2000 UNITS TABS Take 1 tablet by mouth daily.    . Coenzyme Q10 (COQ10) 100 MG CAPS Take 1 capsule by mouth daily.    Marland Kitchen doxazosin (CARDURA) 8 MG tablet TAKE ONE-FOURTH TABLET BY MOUTH EVERY DAY 90 tablet 0  . FIBER COMPLETE PO Take 1 tablet by mouth daily.    . hydrocortisone 2.5 % ointment APPLY TO AFFECTED AREA AT BEDTIME AS DIRECTED 20 g 7  . levothyroxine (SYNTHROID, LEVOTHROID) 25 MCG tablet TAKE ONE AND ONE-HALF TABLETS BY MOUTH ONCE DAILY,EXCEPT TAKE 1 TABLET BY MOUTH ON WEDNESDAY 120 tablet 0  . Multiple Vitamin (MULTIVITAMIN) tablet Take 1 tablet by mouth daily.    . Omega-3 Fatty Acids (FISH OIL) 1000 MG CAPS Take 1 capsule by mouth daily.    . pravastatin (PRAVACHOL) 40 MG tablet Take 1 tablet (40 mg total) by mouth daily. 90 tablet 3   Facility-Administered Medications Prior to Visit  Medication Dose Route Frequency Provider Last Rate Last Admin  . 0.9 %  sodium chloride infusion  500 mL Intravenous Continuous Ladene Artist, MD         Review of Systems:   Constitutional:   No  weight loss, night sweats,  Fevers, chills, fatigue, or  lassitude.  HEENT:   No headaches,  Difficulty swallowing,  Tooth/dental problems, or  Sore throat,                No sneezing, itching, ear ache, nasal congestion, post nasal drip,   CV:  No chest pain,  Orthopnea, PND, swelling in lower extremities,  anasarca, dizziness, palpitations, syncope.   GI  No heartburn, indigestion, abdominal pain, nausea, vomiting, diarrhea, change in bowel habits, loss of appetite, bloody stools.   Resp: No shortness of breath with exertion or at rest.  No excess mucus, no productive cough,  No non-productive cough,  No coughing up of blood.  No change in color of mucus.  No wheezing.  No chest wall deformity  Skin: no rash or lesions.  GU: no dysuria, change in color of urine, no urgency or frequency.  No flank pain, no hematuria   MS:  No joint pain or swelling.  No decreased range of motion.  No back pain.    Physical Exam  BP 140/60 (BP Location: Left  Arm, Patient Position: Sitting, Cuff Size: Normal)   Pulse 83   Temp (!) 97.2 F (36.2 C) (Temporal)   Ht 6\' 2"  (1.88 m)   Wt 192 lb 3.2 oz (87.2 kg)   SpO2 99%   BMI 24.68 kg/m   GEN: A/Ox3; pleasant , NAD, well nourished    HEENT:  [place]/AT,   , NOSE-clear, THROAT-clear, no lesions, no postnasal drip or exudate noted.   NECK:  Supple w/ fair ROM; no JVD; normal carotid impulses w/o bruits; no thyromegaly or nodules palpated; no lymphadenopathy.    RESP  Clear  P & A; w/o, wheezes/ rales/ or rhonchi. no accessory muscle use, no dullness to percussion  CARD:  RRR, no m/r/g, no peripheral edema, pulses intact, no cyanosis or clubbing.  GI:   Soft & nt; nml bowel sounds; no organomegaly or masses detected.   Musco: Warm bil, no deformities or joint swelling noted.   Neuro: alert, no focal deficits noted.    Skin: Warm, scattered papular, plaque lesions along lower extremities, some areas have some mild hyperpigmentation.    Lab Results:  BNP No results found for: BNP  ProBNP No results found for: PROBNP  Imaging: CT Chest Wo Contrast  Result Date: 04/09/2020 CLINICAL DATA:  Pulmonary nodule follow-up EXAM: CT CHEST WITHOUT CONTRAST TECHNIQUE: Multidetector CT imaging of the chest was performed following the standard protocol  without IV contrast. COMPARISON:  03/17/2020 FINDINGS: Cardiovascular: Heart size is normal. No pericardial effusion. Thoracic aorta is normal in course and caliber. Minimal atherosclerotic calcification of the aorta and coronary arteries. Main pulmonary trunk is nondilated. Mediastinum/Nodes: Small calcified mediastinal and right hilar lymph nodes are again seen. No new or enlarging thoracic adenopathy. Thyroid, trachea, and esophagus within normal limits. Lungs/Pleura: Subpleural nodule at the periphery of the right upper lobe is again seen measuring approximately 13 x 7 mm (series 3, image 46) previously measured approximately 12 x 9 mm. No significant interval change is appreciated when given differences in slice selection. There is associated pleural thickening and pleuroparenchymal scarring. Two new nodules within the right upper lobe both measuring up to 8 mm with irregular margins (series 3, images 76 and 79). Left lung remains clear. No pleural effusion or pneumothorax. Upper Abdomen: No acute abnormality. Musculoskeletal: No chest wall mass or suspicious bone lesions identified. IMPRESSION: 1. No significant interval change in size of the subpleural nodule at the periphery of the right upper lobe with associated pleural thickening and pleuroparenchymal scarring. 2. Two new nodules within the right upper lobe both measuring up to 8 mm with irregular margins. Findings are favored to represent an infectious or inflammatory process given the rapid interval development, although malignancy is not excluded. Recommend follow-up noncontrast CT in 3-6 months. Aortic Atherosclerosis (ICD10-I70.0). Electronically Signed   By: Davina Poke D.O.   On: 04/09/2020 16:18   CT ANGIO CHEST PE W OR WO CONTRAST  Result Date: 03/17/2020 CLINICAL DATA:  Chest pain. EXAM: CT ANGIOGRAPHY CHEST WITH CONTRAST TECHNIQUE: Multidetector CT imaging of the chest was performed using the standard protocol during bolus  administration of intravenous contrast. Multiplanar CT image reconstructions and MIPs were obtained to evaluate the vascular anatomy. CONTRAST:  34mL ISOVUE-370 IOPAMIDOL (ISOVUE-370) INJECTION 76% COMPARISON:  May 06, 2019. FINDINGS: Cardiovascular: Satisfactory opacification of the pulmonary arteries to the segmental level. No evidence of pulmonary embolism. Normal heart size. No pericardial effusion. Mediastinum/Nodes: Calcified right hilar and subcarinal adenopathy is noted consistent with chronic granulomatous disease. Thyroid gland and esophagus  are unremarkable. Lungs/Pleura: No pneumothorax or pleural effusion is noted. Mild biapical scarring is noted. New 12 x 9 mm subpleural nodule is noted with irregular margins in the right upper lobe best seen on image number 42 of series 7. Stable irregular density with calcifications seen in right posterior costophrenic sulcus consistent with scarring or calcified granuloma. Upper Abdomen: No acute abnormality. Musculoskeletal: No chest wall abnormality. No acute or significant osseous findings. Review of the MIP images confirms the above findings. IMPRESSION: 1. No definite evidence of pulmonary embolus. 2. New 12 x 9 mm subpleural nodule is noted with irregular margins in the right upper lobe. It is uncertain if this represents focal inflammation or possibly neoplasm. Follow-up unenhanced chest CT in 3-4 weeks is recommended to ensure stability or resolution. 3. Calcified right hilar and subcarinal adenopathy is noted consistent with chronic granulomatous disease. Electronically Signed   By: Marijo Conception M.D.   On: 03/17/2020 14:13      PFT Results Latest Ref Rng & Units 07/08/2019  FVC-Pre L 5.16  FVC-Predicted Pre % 95  FVC-Post L 5.22  FVC-Predicted Post % 96  Pre FEV1/FVC % % 77  Post FEV1/FCV % % 78  FEV1-Pre L 3.99  FEV1-Predicted Pre % 99  FEV1-Post L 4.06  DLCO uncorrected ml/min/mmHg 28.72  DLCO UNC% % 93  DLCO corrected ml/min/mmHg  28.72  DLCO COR %Predicted % 93  DLVA Predicted % 106  TLC L 7.53  TLC % Predicted % 93  RV % Predicted % 100    No results found for: NITRICOXIDE      Assessment & Plan:   Lung nodules New right upper lobe lung nodules, 2 nodules measuring up to 8 mm and one nodule measuring 12 mm.  Serial CT chest has showed these 2 additional nodules.  Possible inflammatory versus infectious source.  Patient does have risk factors with family history of lung cancer and a never smoker.  Has no associated symptoms of weight loss or hemoptysis.  Previous CT showed calcified adenopathy consistent with chronic granulomatous disease. Also has a skin rash questionable etiology.  Will check labs including sed rate, ACE level, ANA, RA factor. We will treat with empiric antibiotic and steroids.  Repeat CT chest in 6 weeks.  If CT chest shows no significant change or increase in size will need a follow-up PET scan and most likely tissue sampling. 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   Plan  Patient Instructions  Zpack take as directed.  Prednisone taper over next week.  Labs today.  Follow up with Dermatology as discussed.  CT chest w/out contrast in 6 week Follow up in 6 weeks with Dr. Chase Caller or Shahana Capes NP       Skin rash Skin rash questionable etiology.  Unclear if this is related to current pulmonary problem however of asked him to return back to dermatology for further evaluation and possible lesion sampling.  Cough Minimum cough and shortness of breath.  Previous CT showed no evidence of interstitial lung disease.  Pulmonary function testing was normal.  May use over-the-counter cough control products as needed.     Rexene Edison, NP 04/12/2020

## 2020-04-12 NOTE — Assessment & Plan Note (Addendum)
New right upper lobe lung nodules, 2 nodules measuring up to 8 mm and one nodule measuring 12 mm.  Serial CT chest has showed these 2 additional nodules.  Possible inflammatory versus infectious source.  Patient does have risk factors with family history of lung cancer and a never smoker.  Has no associated symptoms of weight loss or hemoptysis.  Previous CT showed calcified adenopathy consistent with chronic granulomatous disease. Also has a skin rash questionable etiology.  Will check labs including sed rate, ACE level, ANA, RA factor. We will treat with empiric antibiotic and steroids.  Repeat CT chest in 6 weeks.  If CT chest shows no significant change or increase in size will need a follow-up PET scan and most likely tissue sampling. 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   Plan  Patient Instructions  Zpack take as directed.  Prednisone taper over next week.  Labs today.  Follow up with Dermatology as discussed.  CT chest w/out contrast in 6 week Follow up in 6 weeks with Dr. Chase Caller or Royal Piedra NP

## 2020-04-14 ENCOUNTER — Other Ambulatory Visit: Payer: Self-pay

## 2020-04-14 ENCOUNTER — Encounter: Payer: Self-pay | Admitting: Dermatology

## 2020-04-14 ENCOUNTER — Ambulatory Visit: Payer: Medicare Other | Admitting: Pulmonary Disease

## 2020-04-14 ENCOUNTER — Ambulatory Visit: Payer: Medicare Other | Admitting: Dermatology

## 2020-04-14 DIAGNOSIS — L308 Other specified dermatitis: Secondary | ICD-10-CM

## 2020-04-14 DIAGNOSIS — L291 Pruritus scroti: Secondary | ICD-10-CM

## 2020-04-14 DIAGNOSIS — Z85828 Personal history of other malignant neoplasm of skin: Secondary | ICD-10-CM

## 2020-04-14 DIAGNOSIS — D485 Neoplasm of uncertain behavior of skin: Secondary | ICD-10-CM

## 2020-04-14 DIAGNOSIS — L309 Dermatitis, unspecified: Secondary | ICD-10-CM

## 2020-04-14 DIAGNOSIS — Z1283 Encounter for screening for malignant neoplasm of skin: Secondary | ICD-10-CM | POA: Diagnosis not present

## 2020-04-14 LAB — ANGIOTENSIN CONVERTING ENZYME: Angiotensin-Converting Enzyme: 35 U/L (ref 9–67)

## 2020-04-14 LAB — ANA: Anti Nuclear Antibody (ANA): NEGATIVE

## 2020-04-14 MED ORDER — HYDROCORTISONE 2.5 % EX OINT: TOPICAL_OINTMENT | CUTANEOUS | 7 refills | Status: DC

## 2020-04-14 NOTE — Patient Instructions (Signed)

## 2020-04-17 ENCOUNTER — Encounter: Payer: Self-pay | Admitting: Dermatology

## 2020-04-17 NOTE — Progress Notes (Signed)
   Follow-Up Visit   Subjective  Mike Stephens is a 69 y.o. male who presents for the following: Skin Problem (Rash x months- right thigh- pulmonologist wants to know if rash on thigh correlates with possible lung cancer/inflammatory disease).  Wife in room with patient throughout visit.  He had a long list of dermatological issues beyond the rash.  Rash Location: Mostly left leg Duration: Months Quality: Stable or perhaps even improved Associated Signs/Symptoms: Minimal itch Modifying Factors:  Severity:  Timing: Context: Imaging study found initially 1 and subsequently to smaller spots on the left lower lobe which he states he was told was more likely inflammatory than malignant, but pulmonologist hopes that biopsy of skin may help with diagnosis of lung issue.  I discussed in some detail the uncommon association of skin rashes with pulmonary findings including sarcoidosis and forms of granulomatous inflammation and infection.  Objective  Well appearing patient in no apparent distress; mood and affect are within normal limits. 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  There is a remote chance this could represent one of the protean in manifestations of sarcoidosis so shave biopsy obtained.     Objective  Posterior Scrotum: Frequent episodic itching of the scrotum.  No objective inflammatory changes.  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  No rash in leg crease.   A full examination was performed including scalp, head, eyes, ears, nose, lips, neck, chest, axillae, abdomen, back, buttocks, bilateral upper extremities, bilateral lower extremities, hands, feet, fingers, toes, fingernails, and toenails. All findings  within normal limits unless otherwise noted below.   Assessment & Plan    Encounter for screening for malignant neoplasm of skin Neck - Posterior  Annual skin examination, encouraged to examine his own skin with spouse twice annually.  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  Yearly skin check  Dermatitis  Reordered Medications hydrocortisone 2.5 % ointment  Neoplasm of uncertain behavior of skin Left Thigh - Anterior  Skin / nail biopsy Type of biopsy: tangential   Informed consent: discussed and consent obtained   Timeout: patient name, date of birth, surgical site, and procedure verified   Procedure prep:  Patient was prepped and draped in usual sterile fashion (Non sterile) Prep type:  Chlorhexidine Anesthesia: the lesion was anesthetized in a standard fashion   Anesthetic:  1% lidocaine w/ epinephrine 1-100,000 local infiltration Instrument used: flexible razor blade   Outcome: patient tolerated procedure well   Post-procedure details: wound care instructions given    Specimen 1 - Surgical pathology Differential Diagnosis: r/o clinical eczema with pulmonary nodules being investigated  Check Margins: No  Pruritus of scrotum Posterior Scrotum  This may be a combination of a mild irritant intertrigo along with a neurogenic pruritus.  No intervention suggested.     I, Lavonna Monarch, MD, have reviewed all documentation for this visit.  The documentation on 04/17/20 for the exam, diagnosis, procedures, and orders are all accurate and complete.

## 2020-04-26 ENCOUNTER — Telehealth: Payer: Self-pay | Admitting: Internal Medicine

## 2020-04-26 NOTE — Telephone Encounter (Signed)
Called and spoke with patient's wife. She stated that the patient is being investigated for having lung cancer and he is scheduled to receive a covid vaccine. She and the patient have read reports that the covid vaccine tends to cause increased inflammation for cancer patients. They want to know if MR still recommends that he receives the covid vaccine.   She also had a question about alkaline water. They read that adding 12.5g of baking soda to regular, tap water will help patients with cancer. They wanted to know if MR had heard anything about this.   MR, please advise. Thanks!

## 2020-04-26 NOTE — Telephone Encounter (Signed)
pt wife is wondering if its okay if he can go get a COVID vaccine giving current treatment for cancer. pt has an appt scheduled tomrrow for vaccine.  pt has done research about baking soda and water that suppose to make your body more alkaline, pt wanted advice on that as well.  please advise [phone]

## 2020-04-27 ENCOUNTER — Telehealth: Payer: Self-pay

## 2020-04-27 MED ORDER — HALOBETASOL PROPIONATE 0.05 % EX CREA: TOPICAL_CREAM | CUTANEOUS | 1 refills | Status: DC

## 2020-04-27 NOTE — Telephone Encounter (Signed)
-----   Message from Janalyn Harder, MD sent at 04/23/2020  9:42 AM EST ----- Please inform patient that his biopsy did not show anything that could be associated with his pulmonary imaging findings.  However the pathologist was uncertain what the precise diagnosis was.  He should use prescription halobetasol cream daily for 4 to 6 weeks after bathing on his left leg and any other nonfacial/nonfold areas of rash.  Recheck at that time decide if repeat biopsy needed.

## 2020-04-27 NOTE — Telephone Encounter (Signed)
Phone call to patient with his pathology results and Dr. Tafeen's recommendations. Patient aware.  

## 2020-04-30 NOTE — Telephone Encounter (Signed)
1. YEs he should have covid vaccine esp because he has potentail cancer. Like to know the reference where covid vaccine can be a risk in cancer patients.  2. Do not know anything about alkaline water and cancer risk reduction

## 2020-04-30 NOTE — Telephone Encounter (Signed)
Called and spoke with pt's wife Angela Nevin letting her know the info stated by MR and she verbalized understanding and stated she would make pt aware of this info. Nothing further needed.

## 2020-05-25 ENCOUNTER — Other Ambulatory Visit: Payer: Medicare Other

## 2020-06-01 ENCOUNTER — Ambulatory Visit
Admission: RE | Admit: 2020-06-01 | Discharge: 2020-06-01 | Disposition: A | Discharge status: Home / Self Care | Payer: Medicare Other | Source: Ambulatory Visit | Attending: Adult Health | Admitting: Adult Health

## 2020-06-01 ENCOUNTER — Other Ambulatory Visit: Payer: Self-pay

## 2020-06-01 DIAGNOSIS — R918 Other nonspecific abnormal finding of lung field: Secondary | ICD-10-CM

## 2020-06-01 DIAGNOSIS — R0609 Other forms of dyspnea: Secondary | ICD-10-CM

## 2020-06-01 DIAGNOSIS — R0600 Dyspnea, unspecified: Secondary | ICD-10-CM

## 2020-06-03 ENCOUNTER — Other Ambulatory Visit: Payer: Medicare Other

## 2020-06-03 ENCOUNTER — Ambulatory Visit: Payer: Medicare Other | Admitting: Internal Medicine

## 2020-06-03 ENCOUNTER — Other Ambulatory Visit: Payer: Self-pay

## 2020-06-03 ENCOUNTER — Encounter: Payer: Self-pay | Admitting: Internal Medicine

## 2020-06-03 VITALS — BP 130/68 | HR 69 | Temp 97.8°F | Ht 74.0 in | Wt 188.6 lb

## 2020-06-03 DIAGNOSIS — R918 Other nonspecific abnormal finding of lung field: Secondary | ICD-10-CM | POA: Diagnosis not present

## 2020-06-03 NOTE — Progress Notes (Signed)
Subjective:    Patient ID: Mike Stephens, male    DOB: 11-08-1950, 70 y.o.   MRN: [id number]  PCP Velna Hatchet, MD   HPI  IOV 03/17/2019  Chief Complaint  Patient presents with  . Consult    Referred by Dr. Ardeth Perfect due to asbestos exposure. Pt states he has an occ cough with occ clear phlegm that pt says he has had for years.       [place] Integrated Comprehensive Questionnaire  Symptoms: 70 year old male reports a chronic history of shortness of breath and cough of mild to moderate intensity that is stable since its onset.  On the questionnaire he reports onset of dyspnea 1972.  He says several years ago he had a chest x-ray done by Dr. Linna Darner  who was then his primary care physician and there was some changes in the lung that were consistent with him living in the [place].  He says he grew up in [place].  He was also told at that time that the changes were consistent with him having birds in the house.  He said he had pet parakeets in the house.  He says he has mild to moderate dyspnea while climbing up mountains or doing hikes.  It is not necessarily worse.  He says he wants to get to the bottom of all this.  Shortness of breath made worse by exertion and relieved by rest.  Things like bending over and any mask over the face does make him more winded.  He does endorse a mild cough for the last 20 or 30 years since it started it is the same.  He does clear his throat a lot.  He just brings up some phlegm.  It is clear.  There is no hemoptysis or cough when he lies down or cough affecting his voice.  There is no tickle in the back of the throat.  SYMPTOM SCALE - ILD 03/17/2019   O2 use RA  Shortness of Breath 0 -> 5 scale with 5 being worst (score 6 If unable to do)  At rest 0  Simple tasks - showers, clothes change, eating, shaving 1  Household (dishes, doing bed, laundry) 1  Shopping 2  Walking level at own pace 2  Walking keeping up with others of same  age 31  Walking up Stairs 4  Walking up [place] 5  Total (40 - 48) Dyspnea Score 17  How bad is your cough? mild  How bad is your fatigue x        Past Medical History :   -Denies any asthma or COPD or heart failure rheumatoid arthritis or scleroderma or lupus or polymyositis or Sjogren's.  Denies any acid reflux or hiatal hernia.  Denies any immune system disorder.  Denies pulmonary hypertension.  Denies stroke denies seizures.  Denies hepatitis.  Denies tuberculosis.  Denies blood clots denies heart disease.  Several decades ago he had pleurisy.  He had pneumonia a few decades ago.  He has had thyroid issues for several years and sleep apnea for not otherwise specified.   ROS: Positive for fatigue and arthralgia in the hands and the back.  For several decades he does notice pain no color change white-red in the fingers with cold weather.  And he does snore for the few decades.  But denies any ulcers or rash or nausea vomiting or recurrent fever or dysphagia   FAMILY HISTORY of LUNG DISEASE: * -Denies pulmonary fibrosis or COPD  or asthma sarcoidosis or cystic fibrosis or hypersensitivity pneumonitis or autoimmune disease.  His mother-in-law is my patient.   EXPOSURE HISTORY:  -Denies smoking any cigarettes or cigars or pipe.  No marijuana use.  No vaping no cocaine no intravenous drug use.   HOME and HOBBY DETAILS :  -[place] the last 32 years.  Age of the home is 84 years.  There is dampness in the laundry room with a leak.  There might be some mold or mildew in the shower curtains.  There is recent mold or mildew in the shower.  No humidifier use.  Does use CPAP but there is no mold in it.  Does not have a nebulizer machine.  No Jacuzzi use.  No misting [place] inside the house.  He did have a pet bird between the age of 41-18 years.  He does use a feather pillow.  He does not know of any mold in the [facility] duct.  He does do gardening with compost,  woodchips mulch and flowers.  Overall a lot of hypersensitive pneumonitis type of exposures.   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  He did have a bird as a child.  He does use feather pillow.  Did have a parakeet as a child.  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  Some woodwork at [employer] were rewiring at home.   PULMONARY TOXICITY HISTORY (27 items):  -Denies.  There is no imaging to look at.  Simple office walk 185 feet x  3 laps goal with forehead probe 03/17/2019   O2 used ra  Number laps completed 3  Comments about pace normal  Resting Pulse Ox/HR 100% and 62/min  Final Pulse Ox/HR 100% and 80/min  Desaturated </= 88% no  Desaturated <= 3% points no  Got Tachycardic >/= 90/min no  Symptoms at end of test Very mild if any dyspnea  Miscellaneous comments Normal test      03/22/2020  - Visit   70 year old male never smoker followed in our office by Dr. Chase Caller for dyspnea on exertion.  Patient was last seen in our office in March/2021 by EW NP.  Plan of care from that office visit was as follows: Reviewed high-resolution CT chest that showed no evidence of ILD or spaces related pleural disease.  Some small scattered solid pulmonary nodule.  Pulmonary function testing was reviewed that showed no evidence of restriction or obstructive lung disease.  He was encouraged to remain on oral antihistamine as well as nasal steroid seasonally for his upper airway cough syndrome and continue follow-up with the ENT.  Patient was encouraged and scheduled to have this follow-up visit today at the request of primary care provider Dr. Ardeth Perfect.  Patient completed a CT angio of his chest on 03/17/2020.  She had a new 12 x 9 mm subpleural nodule with irregular margins in the right upper lobe.  Is unsure if this represents focal inflammation and possible neoplasm.  Recommending follow-up chest CT  in 3 to 4 weeks to ensure stability or resolution.  Patient reporting today that he is on ongoing shortness of breath with exertion and right-sided chest pain with movement for the last 2 weeks. He has an ongoing cough which is clear.  Reviewed office notes from primary care in [facility] Dr. Ardeth Perfect. Patient was seen on 03/16/2020. This was a work in visit. Reporting pain when breathing  as well as increased lung pain ever since traveling back from [place]. D-dimer was elevated at that time at 0.66. Given patient's age this is actually considered a normal result.  Patient reports that he has sternal chest pain whenever he takes a deep breath in.  He reports this initially happened when he was in [place] then it went away.  Then a few days later he was raking leaves and the symptoms came back.  This is what prompted the visit with primary care.  He still occasionally has this pain especially when taking a full deep breath.  No significant family history of interstitial lung disease or connective tissue disorders.  Father did have tuberculosis is a young adult and did require pneumonectomy.  Mother did die of lung cancer and she was a never smoker.  Patient presents today with his spouse with multiple questions regarding follow-up and work-up.  We will discuss this today.  Patient reports he never developed a fever or had any sort of productive cough.  He was never treated with antibiotics.  Patient was walked in office today was able to complete 3 laps of the desaturations or tachycardia.  Questionaires / Pulmonary Flowsheets:   ACT:  No flowsheet data found.  MMRC: No flowsheet data found.  Epworth:  No flowsheet data found.  Tests:   05/06/2019-CT chest high-res-no evidence of interstitial lung disease, no evidence of asbestos related pleural disease, scattered small solid pulmonary nodules largest being 4 mm, no follow-up needed if patient is low risk,  one-vessel coronary arthrosclerosis  08/17/2015-CT maxillofacial-paranasal sinuses clear aside from minimal mucosal edema at the ostium of the maxillary sinus bilaterally   Dec 2021   04/12/2020 Follow up : Lung nodules  Patient returns for a 1 month follow-up.  Patient was seen last visit for an abnormal CT chest with lung nodule.  Patient was seen initially for a pulmonary consult November 2020 for shortness of breath with activity.  Work-up showed a high-resolution CT chest that was negative for interstitial lung disease.  It showed scattered small solid pulmonary nodules largest 4 mm.  Pulmonary function testing was normal.  And walk test in the office showed no hypoxia with ambulation.    Patient had recent trip to [place] , had sudden onset of dyspnea and chest tightness. Seen by PCP, D Dimer was positive . A CT chest was done March 17, 2020 that was negative for PE.  Mild biapical scarring.  A new 12 x 9 mm subpleural nodule noted with irregular margins in the right upper lobe.  Stable irregular density with calcifications in the right posterior lung consistent with scarring or calcified granuloma. Says that his chest tightness resolved.   A repeat CT chest was done April 09, 2020 that showed no significant interval change in right upper lobe nodule.  Measuring 13 x 7 mm.  Associated pleural thickening and pleural parenchymal scarring.  2 new nodules in the right upper lobe measuring up to 8 mm with irregular margins.  Left lung was clear.  CT impression findings were favored to represent a possible infectious or inflammatory process given the rapid interval development. We discussed his CT results. Minimal dry cough . No Covid infection history . Declines vaccine currently.  15-20 years dry cough and some dyspnea with heavy exercise.  Patient is active does some exercising and walking.  Occasional hiking and biking.  Is able to complete activity but feels more short of breath with  heavy activity. No  increased cough,congestion,  Has noted rash spots on leg.  Went to the dermatologist and prescribed a cream but rash did not resolve.  It is nonpruritic.  Mild hyper pigmentation. FH + mother died lung cancer - never smoker .  Patient denies any unintentional weight loss.  No hemoptysis.   04/12/2020 Follow up : Lung nodules  Patient returns for a 1 month follow-up.  Patient was seen last visit for an abnormal CT chest with lung nodule.  Patient was seen initially for a pulmonary consult November 2020 for shortness of breath with activity.  Work-up showed a high-resolution CT chest that was negative for interstitial lung disease.  It showed scattered small solid pulmonary nodules largest 4 mm.  Pulmonary function testing was normal.  And walk test in the office showed no hypoxia with ambulation.    Patient had recent trip to [place] , had sudden onset of dyspnea and chest tightness. Seen by PCP, D Dimer was positive . A CT chest was done March 17, 2020 that was negative for PE.  Mild biapical scarring.  A new 12 x 9 mm subpleural nodule noted with irregular margins in the right upper lobe.  Stable irregular density with calcifications in the right posterior lung consistent with scarring or calcified granuloma. Says that his chest tightness resolved.   A repeat CT chest was done April 09, 2020 that showed no significant interval change in right upper lobe nodule.  Measuring 13 x 7 mm.  Associated pleural thickening and pleural parenchymal scarring.  2 new nodules in the right upper lobe measuring up to 8 mm with irregular margins.  Left lung was clear.  CT impression findings were favored to represent a possible infectious or inflammatory process given the rapid interval development. We discussed his CT results. Minimal dry cough . No Covid infection history . Declines vaccine currently.  15-20 years dry cough and some dyspnea with heavy exercise.  Patient is active does some  exercising and walking.  Occasional hiking and biking.  Is able to complete activity but feels more short of breath with heavy activity. No increased cough,congestion,  Has noted rash spots on leg.  Went to the dermatologist and prescribed a cream but rash did not resolve.  It is nonpruritic.  Mild hyper pigmentation. FH + mother died lung cancer - never smoker .  Patient denies any unintentional weight loss.  No hemoptysis.    OV 06/03/2020  Subjective:  Patient ID: Mike Stephens, male , DOB: 14-Oct-1950 , age 65 y.o. , MRN: [id number] , ADDRESS: [place] [address] PCP Velna Hatchet, MD Patient Care Team: Velna Hatchet, MD as PCP - General (Internal Medicine) Lavonna Monarch, MD as Consulting Physician (Dermatology)  This Provider for this visit: Treatment Team:  Attending Provider: Brand Males, MD    06/03/2020 -   Chief Complaint  Patient presents with  . Follow-up    Pt states he has been doing good since last visit. Pt had a recent CT performed and is here to discuss results.    Follow-up multiple lung nodules HPI Mike Stephens 62 y.o. -returns for follow-up.  I used to see him for shortness of breath and then he is seen nurse practitioner.  In between he developed nodules.  Most recently saw Tammy ferritin December 2021 and he had nodules.  Says had a follow-up CT chest.  All nodules are gone.  Insidious new nodules that look quite inflammatory.  Some of them were greater  than 1 cm.  I personally visualized this with him.  He is here with his wife.  Is quite concerned about these nodules.  We went over different histories.  There is nothing to suggest connective tissue disease.  There is no postnasal drip of poor dentition history there is no acid reflux history.  On the other hand he tells me that he uses firewood for heating in the winter because he enjoys it.  He also has an attic with her my straps.  He goes to the attic daily.  He does not wear a mask.   The attic is dusty.  Sometimes there is mold in it.  He also uses CPAP machine has not been clean for a long time.  Other than that there are no other exposures.  No tuberculosis history.   CT Chest data  CT Chest Wo Contrast  Result Date: 06/02/2020 CLINICAL DATA:  Pulmonary nodules. EXAM: CT CHEST WITHOUT CONTRAST TECHNIQUE: Multidetector CT imaging of the chest was performed following the standard protocol without IV contrast. COMPARISON:  04/09/2020 FINDINGS: Cardiovascular: The heart size is normal. No substantial pericardial effusion. Coronary artery calcification is evident. Atherosclerotic calcification is noted in the wall of the thoracic aorta. Mediastinum/Nodes: No mediastinal lymphadenopathy. Calcified nodal tissue is seen in the subcarinal station and right hilum consistent with granulomatous disease. There is no axillary lymphadenopathy. The esophagus has normal imaging features. Lungs/Pleura: Biapical pleuroparenchymal scarring again noted. 8 mm right upper lobe pulmonary nodule seen on the prior study has almost completely resolved with only some trace residual visible on image 78/5 today. The second adjacent 8 mm nodule identified as new on the prior study has resolved completely in the interval. 7 mm nodule in the lingula (114/5) is new since prior. 15 mm left lower lobe nodule on 143/5 is new in the interval. New 11 mm irregular pulmonary nodule identified right upper lobe on 63/5. 7 mm right upper lobe nodule on 54/5 is new since prior. Calcified granulomata again noted medial right costophrenic sulcus.No focal airspace consolidation. No pleural effusion. Upper Abdomen: Unremarkable. Musculoskeletal: No worrisome lytic or sclerotic osseous abnormality. IMPRESSION: 1. The 2 right upper lobe pulmonary nodules seen as new on the prior study have almost completely resolved completely in the interval. 2. Multiple new bilateral pulmonary nodules measuring up to 15 mm. Rapid interval appearance  suggests infectious/inflammatory etiology. Non-contrast chest CT at 3-6 months is recommended. If the nodules are stable at time of repeat CT, then future CT at 18-24 months (from today's scan) is considered optional for low-risk patients, but is recommended for high-risk patients. This recommendation follows the consensus statement: Guidelines for Management of Incidental Pulmonary Nodules Detected on CT Images: From the Fleischner Society 2017; Radiology 2017; 284:228-243. 3. Calcified granulomatous disease. 4. Aortic Atherosclerosis (ICD10-I70.0). Electronically Signed   By: Misty Stanley M.D.   On: 06/02/2020 09:24     PFT  PFT Results Latest Ref Rng & Units 07/08/2019  FVC-Pre L 5.16  FVC-Predicted Pre % 95  FVC-Post L 5.22  FVC-Predicted Post % 96  Pre FEV1/FVC % % 77  Post FEV1/FCV % % 78  FEV1-Pre L 3.99  FEV1-Predicted Pre % 99  FEV1-Post L 4.06  DLCO uncorrected ml/min/mmHg 28.72  DLCO UNC% % 93  DLCO corrected ml/min/mmHg 28.72  DLCO COR %Predicted % 93  DLVA Predicted % 106  TLC L 7.53  TLC % Predicted % 93  RV % Predicted % 100       has a  past medical history of Benign prostatic hypertrophy, Diverticulosis, Hearing loss in left ear, Heart murmur, History of mitral valve prolapse, Hyperlipidemia, Hypertension, Hypothyroidism, OSA (obstructive sleep apnea), Sleep apnea, and Tubular adenoma of colon (01/2008).   reports that he has never smoked. He has never used smokeless tobacco.  Past Surgical History:  Procedure Laterality Date  . COLONOSCOPY  2013   negative  . COLONOSCOPY W/ POLYPECTOMY  2009   Tubular Adenoma , Dr.Stark  . FLEXIBLE SIGMOIDOSCOPY  2000   For rectal bleeding   . HERNIA REPAIR    . KNEE ARTHROSCOPY     Right  . mastoid surg    . MIDDLE EAR SURGERY     age 70 for infectious complications  . 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 TONSILLECTOMY    . WISDOM TOOTH EXTRACTION      No Known Allergies  Immunization History  Administered  Date(s) Administered  . Hepatitis A 01/03/2005, 07/21/2005  . Hepatitis B 01/03/2005, 02/07/2005, 07/03/2005  . Influenza Split 04/24/2012, 02/03/2014, 02/20/2020  . Influenza, High Dose Seasonal PF 03/16/2019  . Influenza, Quadrivalent, Recombinant, Inj, Pf 02/12/2020  . Pneumococcal Conjugate-13 03/27/2018  . Pneumococcal Polysaccharide-23 04/15/2019, 05/24/2020  . Td 10/31/2004  . Zoster 04/24/2010, 12/22/2010, 03/30/2017  . Zoster Recombinat (Shingrix) 01/22/2017    Family History  Problem Relation Age of Onset  . Tuberculosis Father        in college  . Hypertension Mother   . Coronary artery disease Maternal Uncle        died in sleep, > 55  . Cancer Paternal Grandmother         ? primary  . Colon cancer Maternal Uncle   . Stroke Neg Hx   . Colon polyps Neg Hx   . Rectal cancer Neg Hx   . Stomach cancer Neg Hx   . Esophageal cancer Neg Hx      Current Outpatient Medications:  .  aspirin 81 MG tablet, Take 81 mg by mouth daily., Disp: , Rfl:  .  Calcium Citrate 200 MG TABS, Take 400 mg by mouth daily., Disp: , Rfl:  .  Cholecalciferol (VITAMIN D3) 2000 UNITS TABS, Take 2 tablets by mouth daily., Disp: , Rfl:  .  Coenzyme Q10 (COQ10) 100 MG CAPS, Take 1 capsule by mouth daily., Disp: , Rfl:  .  Cyanocobalamin (VITAMIN B 12 PO), Take 1,000 mg by mouth daily., Disp: , Rfl:  .  doxazosin (CARDURA) 8 MG tablet, TAKE ONE-FOURTH TABLET BY MOUTH EVERY DAY, Disp: 90 tablet, Rfl: 0 .  FIBER COMPLETE PO, Take 1 tablet by mouth daily., Disp: , Rfl:  .  hydrocortisone 2.5 % ointment, APPLY TO AFFECTED AREA AT BEDTIME AS DIRECTED, Disp: 20 g, Rfl: 7 .  levothyroxine (SYNTHROID, LEVOTHROID) 25 MCG tablet, TAKE ONE AND ONE-HALF TABLETS BY MOUTH ONCE DAILY,EXCEPT TAKE 1 TABLET BY MOUTH ON WEDNESDAY, Disp: 120 tablet, Rfl: 0 .  Multiple Vitamins-Minerals (MENS 50+ MULTI VITAMIN/MIN) TABS, Take 1 tablet by mouth daily., Disp: , Rfl:  .  mupirocin ointment (BACTROBAN) 2 %, 1 application 2  (two) times daily., Disp: , Rfl:  .  nystatin cream (MYCOSTATIN), Apply 1 application topically 2 (two) times daily., Disp: , Rfl:  .  Omega-3 Fatty Acids (FISH OIL) 1000 MG CAPS, Take 1 capsule by mouth daily., Disp: , Rfl:  .  pravastatin (PRAVACHOL) 40 MG tablet, Take 1 tablet (40 mg total) by mouth daily., Disp: 90 tablet, Rfl:  3 .  vitamin C (ASCORBIC ACID) 500 MG tablet, Take 500 mg by mouth daily., Disp: , Rfl:  .  Zinc Sulfate (ZINC 15 PO), Take 1 tablet by mouth daily., Disp: , Rfl:   Current Facility-Administered Medications:  .  0.9 %  sodium chloride infusion, 500 mL, Intravenous, Continuous, Ladene Artist, MD      Objective:   Vitals:   06/03/20 1350  BP: 130/68  Pulse: 69  Temp: 97.8 F (36.6 C)  TempSrc: Temporal  SpO2: 98%  Weight: 188 lb 9.6 oz (85.5 kg)  Height: 6\' 2"  (1.88 m)    Estimated body mass index is 24.21 kg/m as calculated from the following:   Height as of this encounter: 6\' 2"  (1.88 m).   Weight as of this encounter: 188 lb 9.6 oz (85.5 kg).  @WEIGHTCHANGE @  Filed Weights   06/03/20 1350  Weight: 188 lb 9.6 oz (85.5 kg)     Physical Exam General: No distress. tall Neuro: Alert and Oriented x 3. GCS 15. Speech normal Psych: Pleasant Resp:  Barrel Chest - no.  Wheeze - no, Crackles - no, No overt respiratory distress CVS: Normal heart sounds. Murmurs - no Ext: Stigmata of Connective Tissue Disease - no HEENT: Normal upper airway. PEERL +. No post nasal drip        Assessment:       ICD-10-CM   1. Multiple lung nodules on CT  R91.8        Plan:     Patient Instructions     ICD-10-CM   1. Multiple lung nodules on CT  R91.8     This rapid waxing and waning nodules.  The size is greater than 1 cm.  I do not know what is causing this but it is possible that exposure to a dirty CPAP for firewood or attic that has dust and mold and my straps and it are causing the problem  The features of the skin do not fit in with a  condition called hypersensitive pneumonitis this typically seen with mold exposure or mice urine.  Previously ANA and angiotensin-converting enzyme blood test were normal  Plan -Check QuantiFERON gold TB test -Avoid complete exposure to your firewood and your attic for the next 3-4 months -Make sure your CPAP is clean before its use - CT chest without contrast in 3-4 months  Follow-up -Return to see Dr. Chase Caller in 3-4 months but after CT scan  ( Level 05 visit: Estb 40-54 min  in  visit type: on-site physical face to visit  in total care time and counseling or/and coordination of care by this undersigned MD - Dr Brand Males. This includes one or more of the following on this same day 06/03/2020: pre-charting, chart review, note writing, documentation discussion of test results, diagnostic or treatment recommendations, prognosis, risks and benefits of management options, instructions, education, compliance or risk-factor reduction. It excludes time spent by the [place] or office staff in the care of the patient. Actual time 40 min)   SIGNATURE    Dr. Brand Males, M.D., F.C.C.P,  Pulmonary and Critical Care Medicine Staff Physician, [place] Director - Interstitial Lung Disease  Program  [facility] [place] [facility] [place], [place], [address]  Pager: [phone], If no answer or between  15:00h - 7:00h: call [phone] Telephone: [phone]  2:32 PM 06/03/2020

## 2020-06-03 NOTE — Patient Instructions (Addendum)
ICD-10-CM   1. Multiple lung nodules on CT  R91.8     This rapid waxing and waning nodules.  The size is greater than 1 cm.  I do not know what is causing this but it is possible that exposure to a dirty CPAP for firewood or attic that has dust and mold and my straps and it are causing the problem  The features of the skin do not fit in with a condition called hypersensitive pneumonitis this typically seen with mold exposure or mice urine.  Previously ANA and angiotensin-converting enzyme blood test were normal  Plan -Check QuantiFERON gold TB test -Avoid complete exposure to your firewood and your attic for the next 3-4 months -Make sure your CPAP is clean before its use - CT chest without contrast in 3-4 months  Follow-up -Return to see Dr. Chase Caller in 3-4 months but after CT scan

## 2020-06-05 LAB — QUANTIFERON-TB GOLD PLUS
Mitogen-NIL: >10 IU/mL
NIL: 0.02 IU/mL
QuantiFERON-TB Gold Plus: NEGATIVE
TB1-NIL: 0 IU/mL
TB2-NIL: 0 IU/mL

## 2020-06-16 ENCOUNTER — Ambulatory Visit: Payer: Medicare Other | Admitting: Dermatology

## 2020-06-18 NOTE — Progress Notes (Signed)
Quantiferon gold TB test Is negative . Pls let patient know. This negative test has clinical significance

## 2020-07-13 ENCOUNTER — Ambulatory Visit: Payer: Medicare Other | Admitting: Dermatology

## 2020-08-09 ENCOUNTER — Ambulatory Visit: Payer: Medicare Other | Admitting: Sports Medicine

## 2020-08-10 ENCOUNTER — Ambulatory Visit: Payer: Medicare Other | Admitting: Sports Medicine

## 2020-08-10 ENCOUNTER — Other Ambulatory Visit: Payer: Self-pay

## 2020-08-10 DIAGNOSIS — M25562 Pain in left knee: Secondary | ICD-10-CM | POA: Diagnosis not present

## 2020-08-10 NOTE — Assessment & Plan Note (Signed)
History, presentation, and exam most consistent with arthritis flare of the left knee although he has no previous history or diagnosis - cont with OTC Advil as needed - HEP given - Knee sleeve as needed when active - RICE with activity modification; did encourage him to continue to walk, swim, ride a stationary bike and/or use stair master all as tolerated - We will see him back in 4 weeks and if no or minimal improvement start with x-rays and consider injection

## 2020-08-10 NOTE — Progress Notes (Signed)
    SUBJECTIVE:   CHIEF COMPLAINT / HPI:   Acute Left Knee Pain Mr. Mike Stephens is a new patient to this practice who presents today with new onset left knee pain. He has not had any issues with his left knee before his pain began about 2 weeks ago playing tennis with his grand children. He had no acute injury, no pop, no snapping, and no trauma. He says the began as an ache on the inside of his knee and slowly got worse later in the day. It got so bad he needed crutches as walking on it was too painful. He started taking Advil x2 three times per day as well as relative rest and ice that has helped. His pain is much improved but he continues to have moderate pain on the medial side of his knee that is now intermittent. Gets worse with activity. Can feel a "crunchiness" at times. No obvious swelling or redness.  PERTINENT  PMH / PSH: OSA, Diverticulosis, hypothyroidism, HLD, Hx of lung nodles  OBJECTIVE:   BP 118/60   Ht 6\' 2"  (1.88 m)   Wt 188 lb (85.3 kg)   BMI 24.14 kg/m   No flowsheet data found.  Knee, Left: Inspection was negative for erythema, ecchymosis, and positive for mild swelling. Presence of varicose veins. No obvious bony abnormalities or signs of osteophyte development. Palpation yielded no asymmetric warmth; Positive for medial joint line tenderness; Positive for patellar crepitus. Patellar and quadriceps tendons unremarkable, and no tenderness of the pes anserine bursa. No obvious Baker's cyst development. ROM normal in flexion (135 degrees) and extension (0 degrees). Normal hamstring and quadriceps strength. Neurovascularly intact bilaterally. Special Tests  - Cruciate Ligaments:   - Anterior Drawer:  NEG - Posterior Drawer: NEG   - Lachman:  NEG  - Collateral Ligaments:   - Varus/Valgus Stress test: NEG  - Meniscus:   - McMurray's: NEG  - Patella:   - Patellar grind/compression: NEG  ASSESSMENT/PLAN:   Left knee pain History, presentation, and exam most  consistent with arthritis flare of the left knee although he has no previous history or diagnosis - cont with OTC Advil as needed - HEP given - Knee sleeve as needed when active - RICE with activity modification; did encourage him to continue to walk, swim, ride a stationary bike and/or use stair master all as tolerated - We will see him back in 4 weeks and if no or minimal improvement start with x-rays and consider injection     Nuala Alpha, DO PGY-4, Sports Medicine Fellow [place]  Patient seen and evaluated with the sports medicine fellow.  I agree with the above plan of care.  Patient's symptoms have improved dramatically over the past week or 2.  Proceed with treatment as above and follow-up in 4 weeks.  If symptoms persist, consider x-rays as well as cortisone injection.  Call with questions or concerns in the interim.

## 2020-08-10 NOTE — Patient Instructions (Signed)
It was great to meet you today! Thank you for letting me participate in your care!  Today, we discussed your left knee pain which is already getting better. 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 You can continue Advil as needed and I do want you using Voltaren gel as needed as well. Please ice and elevate the knee at as needed to help with swelling and pain and you can consider getting a knee sleeve to wear when active. Please do the home exercises you were given in the handout. I will see you back in 4 weeks to ensure you are progressing as expected.   Be well, Harolyn Rutherford, DO PGY-4, Sports Medicine Fellow [place]

## 2020-09-14 ENCOUNTER — Ambulatory Visit: Payer: Medicare Other | Admitting: Sports Medicine

## 2020-09-23 ENCOUNTER — Other Ambulatory Visit: Payer: Medicare Other

## 2020-09-27 ENCOUNTER — Ambulatory Visit
Admission: RE | Admit: 2020-09-27 | Discharge: 2020-09-27 | Disposition: A | Discharge status: Home / Self Care | Payer: Medicare Other | Source: Ambulatory Visit | Attending: Internal Medicine | Admitting: Internal Medicine

## 2020-09-27 ENCOUNTER — Other Ambulatory Visit: Payer: Self-pay

## 2020-09-27 DIAGNOSIS — R918 Other nonspecific abnormal finding of lung field: Secondary | ICD-10-CM

## 2020-09-29 NOTE — Progress Notes (Signed)
Will discuss 10/04/20 visit [id number] IMPRESSION: 1. The previously noted pulmonary nodules of concern have essentially completely resolved, indicative of benign infectious or inflammatory nodules on the prior study. 2. There continues to be nodular areas of bilateral pleuroparenchymal thickening and architectural distortion in the apices of both lungs, strongly favored to be benign. Repeat noncontrast chest CT is recommended in 12 months to ensure continued stability. This recommendation follows the consensus statement: Guidelines for Management of Incidental Pulmonary Nodules Detected on CT Images: From the Fleischner Society 2017; Radiology 2017; 284:228-243. 3. Aortic atherosclerosis, in addition to left anterior descending coronary artery disease. Please note that although the presence of coronary artery calcium documents the presence of coronary artery disease, the severity of this disease and any potential stenosis cannot be assessed on this non-gated CT examination. Assessment for potential risk factor modification, dietary therapy or pharmacologic therapy may be warranted, if clinically indicated.  Aortic Atherosclerosis (ICD10-I70.0).   Electronically Signed   By: Vinnie Langton M.D.   On: 09/28/2020 08:53

## 2020-10-04 ENCOUNTER — Other Ambulatory Visit: Payer: Self-pay

## 2020-10-04 ENCOUNTER — Encounter: Payer: Self-pay | Admitting: Internal Medicine

## 2020-10-04 ENCOUNTER — Ambulatory Visit: Payer: Medicare Other | Admitting: Internal Medicine

## 2020-10-04 VITALS — BP 112/60 | HR 55 | Ht 74.0 in | Wt 190.8 lb

## 2020-10-04 DIAGNOSIS — R590 Localized enlarged lymph nodes: Secondary | ICD-10-CM

## 2020-10-04 DIAGNOSIS — J984 Other disorders of lung: Secondary | ICD-10-CM | POA: Diagnosis not present

## 2020-10-04 DIAGNOSIS — I2510 Atherosclerotic heart disease of native coronary artery without angina pectoris: Secondary | ICD-10-CM

## 2020-10-04 DIAGNOSIS — R918 Other nonspecific abnormal finding of lung field: Secondary | ICD-10-CM

## 2020-10-04 NOTE — Progress Notes (Signed)
IOV 03/17/2019  Chief Complaint  Patient presents with   Consult    Referred by Dr. Ardeth Perfect due to asbestos exposure. Pt states he has an occ cough with occ clear phlegm that pt says he has had for years.       [facility] Integrated Comprehensive Questionnaire  Symptoms: 70 year old male reports a chronic history of shortness of breath and cough of mild to moderate intensity that is stable since its onset.  On the questionnaire he reports onset of dyspnea 1972.  He says several years ago he had a chest x-ray done by Dr. Linna Darner  who was then his primary care physician and there was some changes in the lung that were consistent with him living in the [place].  He says he grew up in [place].  He was also told at that time that the changes were consistent with him having birds in the house.  He said he had pet parakeets in the house.  He says he has mild to moderate dyspnea while climbing up mountains or doing hikes.  It is not necessarily worse.  He says he wants to get to the bottom of all this.  Shortness of breath made worse by exertion and relieved by rest.  Things like bending over and any mask over the face does make him more winded.  He does endorse a mild cough for the last 20 or 30 years since it started it is the same.  He does clear his throat a lot.  He just brings up some phlegm.  It is clear.  There is no hemoptysis or cough when he lies down or cough affecting his voice.  There is no tickle in the back of the throat.  SYMPTOM SCALE - ILD 03/17/2019   O2 use RA  Shortness of Breath 0 -> 5 scale with 5 being worst (score 6 If unable to do)  At rest 0  Simple tasks - showers, clothes change, eating, shaving 1  Household (dishes, doing bed, laundry) 1  Shopping 2  Walking level at own pace 2  Walking keeping up with others of same age 92  Walking up Stairs 4  Walking up [place] 5  Total (40 - 48) Dyspnea Score 17  How bad is your cough? mild  How bad is your  fatigue x        Past Medical History :   -Denies any asthma or COPD or heart failure rheumatoid arthritis or scleroderma or lupus or polymyositis or Sjogren's.  Denies any acid reflux or hiatal hernia.  Denies any immune system disorder.  Denies pulmonary hypertension.  Denies stroke denies seizures.  Denies hepatitis.  Denies tuberculosis.  Denies blood clots denies heart disease.  Several decades ago he had pleurisy.  He had pneumonia a few decades ago.  He has had thyroid issues for several years and sleep apnea for not otherwise specified.   ROS: Positive for fatigue and arthralgia in the hands and the back.  For several decades he does notice pain no color change white-red in the fingers with cold weather.  And he does snore for the few decades.  But denies any ulcers or rash or nausea vomiting or recurrent fever or dysphagia   FAMILY HISTORY of LUNG DISEASE: * -Denies pulmonary fibrosis or COPD or asthma sarcoidosis or cystic fibrosis or hypersensitivity pneumonitis or autoimmune disease.  His mother-in-law is my patient.   EXPOSURE HISTORY:  -Denies smoking any cigarettes or cigars  or pipe.  No marijuana use.  No vaping no cocaine no intravenous drug use.   HOME and HOBBY DETAILS :  -[place] 32 years.  Age of the home is 103 years.  There is dampness in the laundry room with a leak.  There might be some mold or mildew in the shower curtains.  There is recent mold or mildew in the shower.  No humidifier use.  Does use CPAP but there is no mold in it.  Does not have a nebulizer machine.  No Jacuzzi use.  No misting Fountain inside the house.  He did have a pet bird between the age of 2-18 years.  He does use a feather pillow.  He does not know of any mold in the [facility] duct.  He does do gardening with compost, woodchips mulch and flowers.  Overall a lot of hypersensitive pneumonitis type of exposures.   OCCUPATIONAL HISTORY (122 questions) :  Positive asbestos exposure not otherwise specified.  He did have a bird as a child.  He does use feather pillow.  Did have a parakeet as a child.  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  Some woodwork at [employer] were rewiring at home.   PULMONARY TOXICITY HISTORY (27 items):  -Denies.  There is no imaging to look at.  Simple office walk 185 feet x  3 laps goal with forehead probe 03/17/2019   O2 used ra  Number laps completed 3  Comments about pace normal  Resting Pulse Ox/HR 100% and 62/min  Final Pulse Ox/HR 100% and 80/min  Desaturated </= 88% no  Desaturated <= 3% points no  Got Tachycardic >/= 90/min no  Symptoms at end of test Very mild if any dyspnea  Miscellaneous comments Normal test      03/22/2020  - Visit   70 year old male never smoker followed in our office by Dr. Chase Caller for dyspnea on exertion.  Patient was last seen in our office in March/2021 by EW NP.  Plan of care from that office visit was as follows: Reviewed high-resolution CT chest that showed no evidence of ILD or spaces related pleural disease.  Some small scattered solid pulmonary nodule.  Pulmonary function testing was reviewed that showed no evidence of restriction or obstructive lung disease.  He was encouraged to remain on oral antihistamine as well as nasal steroid seasonally for his upper airway cough syndrome and continue follow-up with the ENT.  Patient was encouraged and scheduled to have this follow-up visit today at the request of primary care provider Dr. Ardeth Perfect.  Patient completed a CT angio of his chest on 03/17/2020.  She had a new 12 x 9 mm subpleural nodule with irregular margins in the right upper lobe.  Is unsure if this represents focal inflammation and possible neoplasm.  Recommending follow-up chest CT in 3 to 4 weeks to ensure stability or resolution.  Patient reporting today that he is on ongoing shortness of breath with exertion and  right-sided chest pain with movement for the last 2 weeks. He has an ongoing cough which is clear.  Reviewed office notes from primary care in [facility] Dr. Ardeth Perfect. Patient was seen on 03/16/2020. This was a work in visit. Reporting pain when breathing as well as increased lung pain ever since traveling back from [place]. D-dimer was elevated at that time at 0.66. Given patient's age this is actually considered a  normal result.  Patient reports that he has sternal chest pain whenever he takes a deep breath in.  He reports this initially happened when he was in [place] then it went away.  Then a few days later he was raking leaves and the symptoms came back.  This is what prompted the visit with primary care.  He still occasionally has this pain especially when taking a full deep breath.  No significant family history of interstitial lung disease or connective tissue disorders.  Father did have tuberculosis is a young adult and did require pneumonectomy.  Mother did die of lung cancer and she was a never smoker.  Patient presents today with his spouse with multiple questions regarding follow-up and work-up.  We will discuss this today.  Patient reports he never developed a fever or had any sort of productive cough.  He was never treated with antibiotics.  Patient was walked in office today was able to complete 3 laps of the desaturations or tachycardia.  Questionaires / Pulmonary Flowsheets:   ACT:  No flowsheet data found.  MMRC: No flowsheet data found.  Epworth:  No flowsheet data found.  Tests:   05/06/2019-CT chest high-res-no evidence of interstitial lung disease, no evidence of asbestos related pleural disease, scattered small solid pulmonary nodules largest being 4 mm, no follow-up needed if patient is low risk, one-vessel coronary arthrosclerosis  08/17/2015-CT maxillofacial-paranasal sinuses clear aside from minimal mucosal edema at the ostium  of the maxillary sinus bilaterally   Dec 2021   04/12/2020 Follow up : Lung nodules  Patient returns for a 1 month follow-up.  Patient was seen last visit for an abnormal CT chest with lung nodule.  Patient was seen initially for a pulmonary consult November 2020 for shortness of breath with activity.  Work-up showed a high-resolution CT chest that was negative for interstitial lung disease.  It showed scattered small solid pulmonary nodules largest 4 mm.  Pulmonary function testing was normal.  And walk test in the office showed no hypoxia with ambulation.    Patient had recent trip to [place] , had sudden onset of dyspnea and chest tightness. Seen by PCP, D Dimer was positive . A CT chest was done March 17, 2020 that was negative for PE.  Mild biapical scarring.  A new 12 x 9 mm subpleural nodule noted with irregular margins in the right upper lobe.  Stable irregular density with calcifications in the right posterior lung consistent with scarring or calcified granuloma. Says that his chest tightness resolved.   A repeat CT chest was done April 09, 2020 that showed no significant interval change in right upper lobe nodule.  Measuring 13 x 7 mm.  Associated pleural thickening and pleural parenchymal scarring.  2 new nodules in the right upper lobe measuring up to 8 mm with irregular margins.  Left lung was clear.  CT impression findings were favored to represent a possible infectious or inflammatory process given the rapid interval development. We discussed his CT results. Minimal dry cough . No Covid infection history . Declines vaccine currently.  15-20 years dry cough and some dyspnea with heavy exercise.  Patient is active does some exercising and walking.  Occasional hiking and biking.  Is able to complete activity but feels more short of breath with heavy activity. No increased cough,congestion,  Has noted rash spots on leg.  Went to the dermatologist and prescribed a cream but rash did  not resolve.  It is nonpruritic.  Mild  hyper pigmentation. FH + mother died lung cancer - never smoker .  Patient denies any unintentional weight loss.  No hemoptysis.   04/12/2020 Follow up : Lung nodules  Patient returns for a 1 month follow-up.  Patient was seen last visit for an abnormal CT chest with lung nodule.  Patient was seen initially for a pulmonary consult November 2020 for shortness of breath with activity.  Work-up showed a high-resolution CT chest that was negative for interstitial lung disease.  It showed scattered small solid pulmonary nodules largest 4 mm.  Pulmonary function testing was normal.  And walk test in the office showed no hypoxia with ambulation.    Patient had recent trip to [place] , had sudden onset of dyspnea and chest tightness. Seen by PCP, D Dimer was positive . A CT chest was done March 17, 2020 that was negative for PE.  Mild biapical scarring.  A new 12 x 9 mm subpleural nodule noted with irregular margins in the right upper lobe.  Stable irregular density with calcifications in the right posterior lung consistent with scarring or calcified granuloma. Says that his chest tightness resolved.   A repeat CT chest was done April 09, 2020 that showed no significant interval change in right upper lobe nodule.  Measuring 13 x 7 mm.  Associated pleural thickening and pleural parenchymal scarring.  2 new nodules in the right upper lobe measuring up to 8 mm with irregular margins.  Left lung was clear.  CT impression findings were favored to represent a possible infectious or inflammatory process given the rapid interval development. We discussed his CT results. Minimal dry cough . No Covid infection history . Declines vaccine currently.  15-20 years dry cough and some dyspnea with heavy exercise.  Patient is active does some exercising and walking.  Occasional hiking and biking.  Is able to complete activity but feels more short of breath with heavy activity. No  increased cough,congestion,  Has noted rash spots on leg.  Went to the dermatologist and prescribed a cream but rash did not resolve.  It is nonpruritic.  Mild hyper pigmentation. FH + mother died lung cancer - never smoker .  Patient denies any unintentional weight loss.  No hemoptysis.    OV 06/03/2020  Subjective:  Patient ID: Mike Stephens, male , DOB: November 22, 1950 , age 44 y.o. , MRN: [id number] , ADDRESS: [address] [place] [id number] PCP Velna Hatchet, MD Patient Care Team: Velna Hatchet, MD as PCP - General (Internal Medicine) Lavonna Monarch, MD as Consulting Physician (Dermatology)  This Provider for this visit: Treatment Team:  Attending Provider: Brand Males, MD    06/03/2020 -   Chief Complaint  Patient presents with   Follow-up    Pt states he has been doing good since last visit. Pt had a recent CT performed and is here to discuss results.    Follow-up multiple lung nodules HPI Mike Stephens 14 y.o. -returns for follow-up.  I used to see him for shortness of breath and then he is seen nurse practitioner.  In between he developed nodules.  Most recently saw Tammy ferritin December 2021 and he had nodules.  Says had a follow-up CT chest.  All nodules are gone.  Insidious new nodules that look quite inflammatory.  Some of them were greater than 1 cm.  I personally visualized this with him.  He is here with his wife.  Is quite concerned about these nodules.  We went over different  histories.  There is nothing to suggest connective tissue disease.  There is no postnasal drip of poor dentition history there is no acid reflux history.  On the other hand he tells me that he uses firewood for heating in the winter because he enjoys it.  He also has an attic with her my straps.  He goes to the attic daily.  He does not wear a mask.  The attic is dusty.  Sometimes there is mold in it.  He also uses CPAP machine has not been clean for a long time.  Other than that there are  no other exposures.  No tuberculosis history.   CT Chest data  CT Chest Wo Contrast  Result Date: 06/02/2020 CLINICAL DATA:  Pulmonary nodules. EXAM: CT CHEST WITHOUT CONTRAST TECHNIQUE: Multidetector CT imaging of the chest was performed following the standard protocol without IV contrast. COMPARISON:  04/09/2020 FINDINGS: Cardiovascular: The heart size is normal. No substantial pericardial effusion. Coronary artery calcification is evident. Atherosclerotic calcification is noted in the wall of the thoracic aorta. Mediastinum/Nodes: No mediastinal lymphadenopathy. Calcified nodal tissue is seen in the subcarinal station and right hilum consistent with granulomatous disease. There is no axillary lymphadenopathy. The esophagus has normal imaging features. Lungs/Pleura: Biapical pleuroparenchymal scarring again noted. 8 mm right upper lobe pulmonary nodule seen on the prior study has almost completely resolved with only some trace residual visible on image 78/5 today. The second adjacent 8 mm nodule identified as new on the prior study has resolved completely in the interval. 7 mm nodule in the lingula (114/5) is new since prior. 15 mm left lower lobe nodule on 143/5 is new in the interval. New 11 mm irregular pulmonary nodule identified right upper lobe on 63/5. 7 mm right upper lobe nodule on 54/5 is new since prior. Calcified granulomata again noted medial right costophrenic sulcus.No focal airspace consolidation. No pleural effusion. Upper Abdomen: Unremarkable. Musculoskeletal: No worrisome lytic or sclerotic osseous abnormality. IMPRESSION: 1. The 2 right upper lobe pulmonary nodules seen as new on the prior study have almost completely resolved completely in the interval. 2. Multiple new bilateral pulmonary nodules measuring up to 15 mm. Rapid interval appearance suggests infectious/inflammatory etiology. Non-contrast chest CT at 3-6 months is recommended. If the nodules are stable at time of repeat CT,  then future CT at 18-24 months (from today's scan) is considered optional for low-risk patients, but is recommended for high-risk patients. This recommendation follows the consensus statement: Guidelines for Management of Incidental Pulmonary Nodules Detected on CT Images: From the Fleischner Society 2017; Radiology 2017; 284:228-243. 3. Calcified granulomatous disease. 4. Aortic Atherosclerosis (ICD10-I70.0). Electronically Signed   By: Misty Stanley M.D.   On: 06/02/2020 09:24   OV 10/04/2020  Subjective:  Patient ID: Mike Stephens, male , DOB: 1951/03/10 , age 80 y.o. , MRN: [id number] , ADDRESS: [place] [id number] PCP Velna Hatchet, MD Patient Care Team: Velna Hatchet, MD as PCP - General (Internal Medicine) Lavonna Monarch, MD as Consulting Physician (Dermatology)  This Provider for this visit: Treatment Team:  Attending Provider: Brand Males, MD    10/04/2020 -   Chief Complaint  Patient presents with   Follow-up    Pt states he has been doing well since last visit and denies any complaints. Pt had recent CT scan performed and is here today to go over the results.     HPI Mike Stephens 62 y.o. -returns for follow-up of his pulmonary nodule.  He presents  with his wife.  I also take care of his mother-in-law who is in her 60s.  He is asymptomatic.  His CT scan of the chest showed- - Resolved pulmonary nodule - Coronary artery calcification.  Last stress test was many years ago.  Denies any chest pain - Calcified mediastinal adenopathy: He grew up in [place] - Bilateral chronic apical lung scarring not consistent with ILD but radiology recommending 1 year follow-up.  He feels asymptomatic and is more open to clinical expectant follow-up.    CT Chest data 09/27/20  Narrative & Impression  CLINICAL DATA:  70 year old male with history of pulmonary nodule. Followup study.   EXAM: CT CHEST WITHOUT CONTRAST   TECHNIQUE: Multidetector CT imaging of the  chest was performed following the standard protocol without IV contrast.   COMPARISON:  Chest CT 06/01/2020.   FINDINGS: Cardiovascular: Heart size is normal. There is no significant pericardial fluid, thickening or pericardial calcification. There is aortic atherosclerosis, as well as atherosclerosis of the great vessels of the mediastinum and the coronary arteries, including calcified atherosclerotic plaque in the left anterior descending coronary artery.   Mediastinum/Nodes: No pathologically enlarged mediastinal or hilar lymph nodes. Multiple densely calcified bilateral hilar lymph nodes are incidentally noted. Please note that accurate exclusion of hilar adenopathy is limited on noncontrast CT scans. Esophagus is unremarkable in appearance. No axillary lymphadenopathy.   Lungs/Pleura: The new pulmonary nodule seen on the most recent prior examination have essentially resolved with some very subtle areas of very mild ground-glass attenuation in their wake, indicative of a benign infectious or inflammatory etiology on the prior study. No other new suspicious appearing pulmonary nodules or masses are noted. There continues to be extensive bilateral apical nodular pleuroparenchymal thickening and architectural distortion, similar to prior studies dating back to May 06, 2019, most compatible with chronic post infectious or inflammatory scarring, with the largest nodular area in the right upper lobe posteriorly near the apex (axial image 39 of series 5) measuring 9 x 4 mm, favored to be benign. No acute consolidative airspace disease. No pleural effusions. Calcified granuloma in the right lower lobe incidentally noted.   Upper Abdomen: Aortic atherosclerosis.   Musculoskeletal: There are no aggressive appearing lytic or blastic lesions noted in the visualized portions of the skeleton.   IMPRESSION: 1. The previously noted pulmonary nodules of concern have essentially  completely resolved, indicative of benign infectious or inflammatory nodules on the prior study. 2. There continues to be nodular areas of bilateral pleuroparenchymal thickening and architectural distortion in the apices of both lungs, strongly favored to be benign. Repeat noncontrast chest CT is recommended in 12 months to ensure continued stability. This recommendation follows the consensus statement: Guidelines for Management of Incidental Pulmonary Nodules Detected on CT Images: From the Fleischner Society 2017; Radiology 2017; 284:228-243. 3. Aortic atherosclerosis, in addition to left anterior descending coronary artery disease. Please note that although the presence of coronary artery calcium documents the presence of coronary artery disease, the severity of this disease and any potential stenosis cannot be assessed on this non-gated CT examination. Assessment for potential risk factor modification, dietary therapy or pharmacologic therapy may be warranted, if clinically indicated.   Aortic Atherosclerosis (ICD10-I70.0).     Electronically Signed   By: Vinnie Langton M.D.   On: 09/28/2020 08:53    No results found.    PFT  PFT Results Latest Ref Rng & Units 07/08/2019  FVC-Pre L 5.16  FVC-Predicted Pre % 95  FVC-Post L 5.22  FVC-Predicted Post % 96  Pre FEV1/FVC % % 77  Post FEV1/FCV % % 78  FEV1-Pre L 3.99  FEV1-Predicted Pre % 99  FEV1-Post L 4.06  DLCO uncorrected ml/min/mmHg 28.72  DLCO UNC% % 93  DLCO corrected ml/min/mmHg 28.72  DLCO COR %Predicted % 93  DLVA Predicted % 106  TLC L 7.53  TLC % Predicted % 93  RV % Predicted % 100       has a past medical history of Benign prostatic hypertrophy, Diverticulosis, Hearing loss in left ear, Heart murmur, History of mitral valve prolapse, Hyperlipidemia, Hypertension, Hypothyroidism, OSA (obstructive sleep apnea), Sleep apnea, and Tubular adenoma of colon (01/2008).   reports that he has never smoked.  He has never used smokeless tobacco.  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   TONSILLECTOMY     WISDOM TOOTH EXTRACTION      No Known Allergies  Immunization History  Administered Date(s) Administered   Hepatitis A 01/03/2005, 07/21/2005   Hepatitis B 01/03/2005, 02/07/2005, 07/03/2005   Influenza Split 04/24/2012, 02/03/2014, 02/20/2020   Influenza, High Dose Seasonal PF 03/16/2019   Influenza, Quadrivalent, Recombinant, Inj, Pf 02/12/2020   Pneumococcal Conjugate-13 03/27/2018   Pneumococcal Polysaccharide-23 04/15/2019, 05/24/2020   Td 10/31/2004   Zoster Recombinat (Shingrix) 01/22/2017   Zoster, Live 04/24/2010, 12/22/2010, 03/30/2017    Family History  Problem Relation Age of Onset   Tuberculosis Father        in college   Hypertension Mother    Coronary artery disease Maternal Uncle        died in sleep, > 8   Cancer Paternal Grandmother         ? primary   Colon cancer Maternal Uncle    Stroke Neg Hx    Colon polyps Neg Hx    Rectal cancer Neg Hx    Stomach cancer Neg Hx    Esophageal cancer Neg Hx      Current Outpatient Medications:    aspirin 81 MG tablet, Take 81 mg by mouth daily., Disp: , Rfl:    Calcium Citrate 200 MG TABS, Take 400 mg by mouth daily., Disp: , Rfl:    Cholecalciferol (VITAMIN D3) 2000 UNITS TABS, Take 2 tablets by mouth daily., Disp: , Rfl:    Coenzyme Q10 (COQ10) 100 MG CAPS, Take 1 capsule by mouth daily., Disp: , Rfl:    Cyanocobalamin (VITAMIN B 12 PO), Take 1,000 mg by mouth daily., Disp: , Rfl:    doxazosin (CARDURA) 8 MG tablet, TAKE ONE-FOURTH TABLET BY MOUTH EVERY DAY, Disp: 90 tablet, Rfl: 0    FIBER COMPLETE PO, Take 1 tablet by mouth daily., Disp: , Rfl:    levothyroxine (SYNTHROID, LEVOTHROID) 25 MCG tablet, TAKE ONE AND ONE-HALF TABLETS BY MOUTH ONCE DAILY,EXCEPT TAKE 1 TABLET BY MOUTH ON WEDNESDAY, Disp: 120 tablet, Rfl: 0   Multiple Vitamins-Minerals (MENS 50+ MULTI VITAMIN/MIN) TABS, Take 1 tablet by mouth daily., Disp: , Rfl:    mupirocin ointment (BACTROBAN) 2 %, 1 application 2 (two) times daily.,  Disp: , Rfl:    nystatin cream (MYCOSTATIN), Apply 1 application topically 2 (two) times daily., Disp: , Rfl:    Omega-3 Fatty Acids (FISH OIL) 1000 MG CAPS, Take 1 capsule by mouth daily., Disp: , Rfl:    pravastatin (PRAVACHOL) 40 MG tablet, Take 1 tablet (40 mg total) by mouth daily., Disp: 90 tablet, Rfl: 3   vitamin C (ASCORBIC ACID) 500 MG tablet, Take 500 mg by mouth daily., Disp: , Rfl:    Zinc Sulfate (ZINC 15 PO), Take 1 tablet by mouth daily., Disp: , Rfl:    hydrocortisone 2.5 % ointment, APPLY TO AFFECTED AREA AT BEDTIME AS DIRECTED (Patient not taking: Reported on 10/04/2020), Disp: 20 g, Rfl: 7  Current Facility-Administered Medications:    0.9 %  sodium chloride infusion, 500 mL, Intravenous, Continuous, Ladene Artist, MD      Objective:   Vitals:   10/04/20 1336  BP: 112/60  Pulse: (!) 55  SpO2: 98%  Weight: 190 lb 12.8 oz (86.5 kg)  Height: 6\' 2"  (1.88 m)    Estimated body mass index is 24.5 kg/m as calculated from the following:   Height as of this encounter: 6\' 2"  (1.88 m).   Weight as of this encounter: 190 lb 12.8 oz (86.5 kg).  @WEIGHTCHANGE @  [employer]   10/04/20 1336  Weight: 190 lb 12.8 oz (86.5 kg)     Physical Exam  General: No distress. Looks well Neuro: Alert and Oriented x 3. GCS 15. Speech normal Psych: Pleasant Resp:  Barrel Chest - no.  Wheeze - no, Crackles - no, No overt respiratory distress CVS: Normal heart sounds. Murmurs - no Ext: Stigmata of Connective Tissue Disease - no HEENT: Normal upper airway. PEERL +. No  post nasal drip        Assessment:       ICD-10-CM   1. Multiple lung nodules on CT  R91.8     2. Apical lung scarring  J98.4     3. Mediastinal adenopathy  R59.0     4. Coronary artery calcification seen on CAT scan  I25.10          Plan:     Patient Instructions  Multiple lung nodules on CT  - rsolved on June 2022 CT chest compared to feb 2022 ct chest  Plan  - no further followup  Apical lung scarring see as of June 2022   - this is chronic an ddoubt will progress  Plan  - clinical followup in 1 year or sooner if you get symptomtatic  Mediastinal adenopathy calcified on CT June 2022  - likely due to histo growing up in [facility]  - no further followup  Coronary artery calcification seen on CAT scan  - asymptomatic  Plan  - refer cards Drs Marin Olp or Dr Croituru  Followup  - 1 year of sooner if needed    SIGNATURE    Dr. Brand Males, M.D., F.C.C.P,  Pulmonary and Critical Care Medicine Staff Physician, [place] Director - Interstitial Lung Disease  Program  Pulmonary [facility] [place], [place], [address]  Pager: [phone], If no answer or between  15:00h - 7:00h: call [phone] Telephone: [phone]  2:00 PM 10/04/2020

## 2020-10-04 NOTE — Addendum Note (Signed)
Addended by: Lorretta Harp on: 10/04/2020 02:03 PM   Modules accepted: Orders

## 2020-10-04 NOTE — Patient Instructions (Addendum)
Multiple lung nodules on CT  - rsolved on June 2022 CT chest compared to feb 2022 ct chest  Plan  - no further followup  Apical lung scarring see as of June 2022   - this is chronic an ddoubt will progress  Plan  - clinical followup in 1 year or sooner if you get symptomtatic  Mediastinal adenopathy calcified on CT June 2022  - likely due to histo growing up in [facility]  - no further followup  Coronary artery calcification seen on CAT scan  - asymptomatic  Plan  - refer cards Drs Marin Olp or Dr Orene Desanctis  Followup  - 1 year of sooner if needed

## 2020-12-10 ENCOUNTER — Other Ambulatory Visit: Payer: Self-pay

## 2020-12-10 ENCOUNTER — Ambulatory Visit: Payer: Medicare Other | Admitting: Cardiovascular Disease

## 2020-12-10 ENCOUNTER — Encounter: Payer: Self-pay | Admitting: Cardiovascular Disease

## 2020-12-10 VITALS — BP 122/66 | HR 65 | Resp 20 | Ht 74.0 in | Wt 190.6 lb

## 2020-12-10 DIAGNOSIS — I2510 Atherosclerotic heart disease of native coronary artery without angina pectoris: Secondary | ICD-10-CM | POA: Diagnosis not present

## 2020-12-10 DIAGNOSIS — I700 Atherosclerosis of aorta: Secondary | ICD-10-CM | POA: Diagnosis not present

## 2020-12-10 DIAGNOSIS — I2584 Coronary atherosclerosis due to calcified coronary lesion: Secondary | ICD-10-CM | POA: Diagnosis not present

## 2020-12-10 DIAGNOSIS — E7800 Pure hypercholesterolemia, unspecified: Secondary | ICD-10-CM

## 2020-12-10 MED ORDER — PRAVASTATIN SODIUM 80 MG PO TABS: 80.0000 mg | ORAL_TABLET | Freq: Every day | ORAL | 3 refills | Status: AC

## 2020-12-10 NOTE — Patient Instructions (Signed)
Medication Instructions:  INCREASE the Pravastatin to 80 mg once daily *If you need a refill on your cardiac medications before your next appointment, please call your pharmacy*   Lab Work: None ordered If you have labs (blood work) drawn today and your tests are completely normal, you will receive your results only by: [place] (if you have MyChart) OR A paper copy in the mail If you have any lab test that is abnormal or we need to change your treatment, we will call you to review the results.   Testing/Procedures: Your physician has requested that you have an exercise tolerance test. For further information please visit [email or web address]. Please also follow instruction sheet, as given. This will take place at [place], [address]. Do not drink or eat foods with caffeine for 24 hours before the test. (Chocolate, coffee, tea, or energy drinks) If you use an inhaler, bring it with you to the test. Do not smoke for 4 hours before the test. Wear comfortable shoes and clothing.  Follow-Up: At [facility], you and your health needs are our priority.  As part of our continuing mission to provide you with exceptional heart care, we have created designated Provider Care Teams.  These Care Teams include your primary Cardiologist (physician) and Advanced Practice Providers (APPs -  Physician Assistants and Nurse Practitioners) who all work together to provide you with the care you need, when you need it.  We recommend signing up for the patient portal called "MyChart".  Sign up information is provided on this After Visit Summary.  MyChart is used to connect with patients for Virtual Visits (Telemedicine).  Patients are able to view lab/test results, encounter notes, upcoming appointments, etc.  Non-urgent messages can be sent to your provider as well.   To learn more about what you can do with MyChart, go to [email or web address].    Your next appointment:   Follow up as  needed with Dr. Sallyanne Kuster

## 2020-12-10 NOTE — Progress Notes (Signed)
duplicate

## 2020-12-10 NOTE — Progress Notes (Signed)
Cardiology Office Note:    Date:  12/11/2020   ID:  Mike Stephens, DOB Apr 07, 1951, MRN [id number]  PCP:  Velna Hatchet, MD   [facility] Providers Cardiologist:  None     Referring MD: Brand Males, MD   Chief Complaint  Patient presents with   Advice Only  Mike Stephens is a 70 y.o. male who is being seen today for the evaluation of aortic and coronary atherosclerotic calcifications at the request of Brand Males, MD.   History of Present Illness:    Mike Stephens is a 70 y.o. male with a hx of hypertension, hypercholesterolemia, OSA, lung nodules, treated hypothyroidism who was found to have atherosclerotic calcifications involving the aorta and coronary arteries on serial CTs performed to evaluate his lung problems.  He is physically active and does not have problems with angina or dyspnea either at rest or with activity, although dyspnea was a problem in the past.  He denies orthopnea, PND, lower extremity edema, intermittent claudication or focal neurological complaints.    He has been taking pravastatin for several years.  His most recent LDL cholesterol was 75 in January 2022.  He eats a very healthy diet.  He does not smoke.  One of his uncles had heart problems, but there is no family history of premature CAD or PAD.  On review of his CTs, there is clear evidence of mild-moderate atherosclerosis in the aortic arch, also in the proximal LAD artery distribution, with scattered calcification also seen in the RCA and circumflex territories.  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  Past Medical History:  Diagnosis Date   Benign prostatic hypertrophy    Diverticulosis    Hearing loss in left ear    Heart murmur    History of mitral valve prolapse    ( clinically NO Mitral regurgitation)    Hyperlipidemia    Hypertension     Hypothyroidism    OSA (obstructive sleep apnea)    [facility]   Sleep apnea    no CPAP used in "[place]" due to collapsed eardrum   Tubular adenoma of colon 01/2008   Dr Fuller Plan    Past Surgical History:  Procedure Laterality Date   COLONOSCOPY  2013   negative   COLONOSCOPY W/ POLYPECTOMY  2009   Tubular Adenoma , Dr.Stark   FLEXIBLE SIGMOIDOSCOPY  2000   For rectal bleeding    HERNIA REPAIR     KNEE ARTHROSCOPY     Right   mastoid surg     MIDDLE EAR SURGERY     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   Cholecalciferol (VITAMIN D3) 2000 UNITS TABS Take 2 tablets by mouth daily.   Coenzyme Q10 (COQ10) 100 MG CAPS Take 1 capsule by mouth daily.   Cyanocobalamin (VITAMIN B 12 PO) Take 1,000 mg by mouth daily.   doxazosin (CARDURA) 8 MG tablet TAKE ONE-FOURTH TABLET BY MOUTH EVERY DAY   FIBER COMPLETE PO Take 1 tablet by mouth daily.   hydrocortisone 2.5 % ointment APPLY TO AFFECTED AREA AT BEDTIME AS DIRECTED   levothyroxine (SYNTHROID, LEVOTHROID) 25 MCG tablet  TAKE ONE AND ONE-HALF TABLETS BY MOUTH ONCE DAILY,EXCEPT TAKE 1 TABLET BY MOUTH ON WEDNESDAY   Multiple Vitamins-Minerals (MENS 50+ MULTI VITAMIN/MIN) TABS Take 1 tablet by mouth daily.   mupirocin ointment (BACTROBAN) 2 % 1 application 2 (two) times daily.   nystatin cream (MYCOSTATIN) Apply 1 application topically 2 (two) times daily.   Omega-3 Fatty Acids (FISH OIL) 1000 MG CAPS Take 1 capsule by mouth daily.   Zinc Sulfate (ZINC 15 PO) Take 1 tablet by mouth once as needed.   [DISCONTINUED] pravastatin (PRAVACHOL) 40 MG tablet Take 1 tablet (40 mg total) by mouth daily.   Current Facility-Administered Medications for the 12/10/20 encounter (Office Visit) with Jaquanna Ballentine, Dani Gobble, MD  Medication   0.9 %  sodium chloride  infusion     Allergies:   Patient has no known allergies.   Social History   Socioeconomic History   Marital status: Married    Spouse name: Not on file   Number of children: 2   Years of education: Not on file   Highest education level: Not on file  Occupational History   Occupation: Retried    Employer: [employer]  Tobacco Use   Smoking status: Never   Smokeless tobacco: Never  Vaping Use   Vaping Use: Never used  Substance and Sexual Activity   Alcohol use: No   Drug use: No   Sexual activity: Not on file  Other Topics Concern   Not on file  Social History Narrative   Not on file   Social Determinants of Health   Financial Resource Strain: Not on file  Food Insecurity: Not on file  Transportation Needs: Not on file  Physical Activity: Not on file  Stress: Not on file  Social Connections: Not on file     Family History: The patient's family history includes Cancer in his paternal grandmother; Colon cancer in his maternal uncle; Coronary artery disease in his maternal uncle; Hypertension in his mother; Tuberculosis in his father. There is no history of Stroke, Colon polyps, Rectal cancer, Stomach cancer, or Esophageal cancer.  ROS:   Please see the history of present illness.     All other systems reviewed and are negative.  EKGs/Labs/Other Studies Reviewed:    The following studies were reviewed today: CT Chest FINDINGS: Cardiovascular: Heart size is normal. There is no significant pericardial fluid, thickening or pericardial calcification. There is aortic atherosclerosis, as well as atherosclerosis of the great vessels of the  mediastinum and the coronary arteries, including calcified atherosclerotic plaque in the left anterior descending coronary artery.  EKG:  EKG is  ordered today.  The ekg ordered today demonstrates Normal sinus rhythm, normal tracing, QTC 428 ms without any repolarization abnormalities.  Recent Labs: 04/12/2020: ALT 26; BUN 22;  Creatinine, Ser 0.88; Hemoglobin 13.2; Platelets 202.0; Potassium 3.8; Sodium 140  Recent Lipid Panel    Component Value Date/Time   CHOL 167 12/27/2012 0921   TRIG 53.0 12/27/2012 0921   HDL 54.80 12/27/2012 0921   CHOLHDL 3 12/27/2012 0921   VLDL 10.6 12/27/2012 0921   LDLCALC 102 (H) 12/27/2012 0921   04/30/2020 Cholesterol 146, HDL 64, LDL 75, triglycerides 35 ALT 24, TSH 3.83, potassium 3.8, creatinine 0.88, hemoglobin 13.2   Risk Assessment/Calculations:           Physical Exam:    VS:  BP 122/66   Pulse 65   Resp 20   Ht '6\' 2"'$  (1.88 m)   Wt 190 lb 9.6 oz (86.5 kg)  SpO2 98%   BMI 24.47 kg/m     Wt Readings from Last 3 Encounters:  12/10/20 190 lb 9.6 oz (86.5 kg)  10/04/20 190 lb 12.8 oz (86.5 kg)  08/10/20 188 lb (85.3 kg)     GEN: Appears younger than stated age, well nourished, well developed in no acute distress HEENT: Normal NECK: No JVD; No carotid bruits LYMPHATICS: No lymphadenopathy CARDIAC: RRR, no murmurs, rubs, gallops RESPIRATORY:  Clear to auscultation without rales, wheezing or rhonchi  ABDOMEN: Soft, non-tender, non-distended MUSCULOSKELETAL:  No edema; No deformity  SKIN: Warm and dry NEUROLOGIC:  Alert and oriented x 3 PSYCHIATRIC:  Normal affect   ASSESSMENT:    1. Coronary artery calcification   2. Hypercholesteremia   3. Aortic atherosclerosis ([place])    PLAN:    In order of problems listed above:  Aortic/coronary atherosclerosis: Without clinical symptoms of disease.  We will schedule for a treadmill ECG stress test.  Would recommend increasing lipid-lowering therapy to achieve target LDL less than 70.  Continue healthy lifestyle, regular physical exercise. HLP: Increase pravastatin to 80 mg and make sure to take it in the evening.  It is reasonable to continue the aspirin 81 mg once daily, especially since he does not have any bleeding issues.   Shared Decision Making/Informed Consent The risks [chest pain, shortness of  breath, cardiac arrhythmias, dizziness, blood pressure fluctuations, myocardial infarction, stroke/transient ischemic attack, and life-threatening complications (estimated to be 1 in 10,000)], benefits (risk stratification, diagnosing coronary artery disease, treatment guidance) and alternatives of an exercise tolerance test were discussed in detail with Mr. Brester and he agrees to proceed.    Medication Adjustments/Labs and Tests Ordered: Current medicines are reviewed at length with the patient today.  Concerns regarding medicines are outlined above.  Orders Placed This Encounter  Procedures   Exercise Tolerance Test   EKG 12-Lead   Meds ordered this encounter  Medications   pravastatin (PRAVACHOL) 80 MG tablet    Sig: Take 1 tablet (80 mg total) by mouth daily.    Dispense:  90 tablet    Refill:  3    Patient Instructions  Medication Instructions:  INCREASE the Pravastatin to 80 mg once daily *If you need a refill on your cardiac medications before your next appointment, please call your pharmacy*   Lab Work: None ordered If you have labs (blood work) drawn today and your tests are completely normal, you will receive your results only by: [place] (if you have MyChart) OR A paper copy in the mail If you have any lab test that is abnormal or we need to change your treatment, we will call you to review the results.   Testing/Procedures: Your physician has requested that you have an exercise tolerance test. For further information please visit [email or web address]. Please also follow instruction sheet, as given. This will take place at [place], [address]. Do not drink or eat foods with caffeine for 24 hours before the test. (Chocolate, coffee, tea, or energy drinks) If you use an inhaler, bring it with you to the test. Do not smoke for 4 hours before the test. Wear comfortable shoes and clothing.  Follow-Up: At [facility], you and your health needs are our  priority.  As part of our continuing mission to provide you with exceptional heart care, we have created designated Provider Care Teams.  These Care Teams include your primary Cardiologist (physician) and Advanced Practice Providers (APPs -  Physician Assistants and Nurse Practitioners) who  all work together to provide you with the care you need, when you need it.  We recommend signing up for the patient portal called "MyChart".  Sign up information is provided on this After Visit Summary.  MyChart is used to connect with patients for Virtual Visits (Telemedicine).  Patients are able to view lab/test results, encounter notes, upcoming appointments, etc.  Non-urgent messages can be sent to your provider as well.   To learn more about what you can do with MyChart, go to [email or web address].    Your next appointment:   Follow up as needed with Dr. Sallyanne Kuster   Signed, Sanda Klein, MD  12/11/2020 8:06 PM    [place]

## 2020-12-17 ENCOUNTER — Telehealth (HOSPITAL_COMMUNITY): Payer: Self-pay | Admitting: *Deleted

## 2020-12-17 NOTE — Telephone Encounter (Signed)
Close encounter 

## 2020-12-21 ENCOUNTER — Ambulatory Visit (HOSPITAL_COMMUNITY)
Admission: RE | Admit: 2020-12-21 | Discharge: 2020-12-21 | Disposition: A | Discharge status: Home / Self Care | Payer: Medicare Other | Source: Ambulatory Visit | Attending: Cardiology | Admitting: Cardiology

## 2020-12-21 ENCOUNTER — Other Ambulatory Visit: Payer: Self-pay

## 2020-12-21 DIAGNOSIS — I2584 Coronary atherosclerosis due to calcified coronary lesion: Secondary | ICD-10-CM | POA: Diagnosis not present

## 2020-12-21 DIAGNOSIS — I2510 Atherosclerotic heart disease of native coronary artery without angina pectoris: Secondary | ICD-10-CM | POA: Diagnosis not present

## 2020-12-21 DIAGNOSIS — E7800 Pure hypercholesterolemia, unspecified: Secondary | ICD-10-CM | POA: Insufficient documentation

## 2020-12-21 LAB — EXERCISE TOLERANCE TEST
Angina Index: 0
Base ST Depression (mm): 0.5 mm
Duke Treadmill Score: 1
Estimated workload: 13.4
Exercise duration (min): 11 min
Exercise duration (sec): 0 s
MPHR: 152 {beats}/min
Peak HR: 153 {beats}/min
Percent HR: 100 %
Rest HR: 75 {beats}/min
ST Depression (mm): 2 mm

## 2020-12-22 ENCOUNTER — Telehealth: Payer: Self-pay | Admitting: Cardiovascular Disease

## 2020-12-22 NOTE — Telephone Encounter (Signed)
Spoke with the patient about his gxt results. He wanted Dr. Sallyanne Kuster to know that he has had two stress tests in the past and abnormal EKGs. He stated that Dr. Linna Darner had always told him that "his electrical signal was off but normal" and that he should always carry his printed EKG with him. This EKG has been signed and clarified by Dr Linna Darner. He will try and send the EKG through MyChart.  He is concerned about doing more testing especially with dye being used if it's not necessary. He is concerned about the dye and it worsening his kidneys and thyroid.

## 2020-12-22 NOTE — Telephone Encounter (Signed)
   Pt's wife calling about EKG report and also result of stress test, she said they need to discuss it with the nurse

## 2020-12-24 DIAGNOSIS — I2510 Atherosclerotic heart disease of native coronary artery without angina pectoris: Secondary | ICD-10-CM

## 2020-12-24 DIAGNOSIS — R9431 Abnormal electrocardiogram [ECG] [EKG]: Secondary | ICD-10-CM

## 2020-12-24 DIAGNOSIS — I2584 Coronary atherosclerosis due to calcified coronary lesion: Secondary | ICD-10-CM

## 2020-12-28 MED ORDER — METOPROLOL TARTRATE 100 MG PO TABS: ORAL_TABLET | ORAL | 0 refills | Status: DC

## 2020-12-29 ENCOUNTER — Encounter: Payer: Self-pay | Admitting: *Deleted

## 2020-12-29 ENCOUNTER — Other Ambulatory Visit: Payer: Self-pay

## 2020-12-29 DIAGNOSIS — R9431 Abnormal electrocardiogram [ECG] [EKG]: Secondary | ICD-10-CM

## 2020-12-29 DIAGNOSIS — I2510 Atherosclerotic heart disease of native coronary artery without angina pectoris: Secondary | ICD-10-CM

## 2020-12-30 LAB — BASIC METABOLIC PANEL
BUN/Creatinine Ratio: 18 (ref 10–24)
BUN: 16 mg/dL (ref 8–27)
CO2: 23 mmol/L (ref 20–29)
Calcium: 9.2 mg/dL (ref 8.6–10.2)
Chloride: 104 mmol/L (ref 96–106)
Creatinine, Ser: 0.9 mg/dL (ref 0.76–1.27)
Glucose: 101 mg/dL — ABNORMAL HIGH (ref 65–99)
Potassium: 4.3 mmol/L (ref 3.5–5.2)
Sodium: 143 mmol/L (ref 134–144)
eGFR: 93 mL/min/{1.73_m2} (ref >59)

## 2020-12-31 ENCOUNTER — Telehealth (HOSPITAL_COMMUNITY): Payer: Self-pay | Admitting: *Deleted

## 2020-12-31 NOTE — Telephone Encounter (Signed)
Reaching out to patient to offer assistance regarding upcoming cardiac imaging study; pt verbalizes understanding of appt date/time, parking situation and where to check in, pre-test NPO status and medications ordered, and verified current allergies; name and call back number provided for further questions should they arise  Gordy Clement RN Navigator Cardiac Imaging [facility] [phone] office [phone] cell  Patient to take 50mg  metoprolol tartrate two hours prior to cardiac CT scan.

## 2021-01-03 ENCOUNTER — Other Ambulatory Visit: Payer: Self-pay

## 2021-01-03 ENCOUNTER — Ambulatory Visit (HOSPITAL_COMMUNITY)
Admission: RE | Admit: 2021-01-03 | Discharge: 2021-01-03 | Disposition: A | Discharge status: Home / Self Care | Payer: Medicare Other | Source: Ambulatory Visit | Attending: Cardiovascular Disease | Admitting: Cardiovascular Disease

## 2021-01-03 DIAGNOSIS — R9431 Abnormal electrocardiogram [ECG] [EKG]: Secondary | ICD-10-CM | POA: Diagnosis not present

## 2021-01-03 MED ORDER — NITROGLYCERIN 0.4 MG SL SUBL
SUBLINGUAL_TABLET | SUBLINGUAL | Status: AC
  Filled 2021-01-03: qty 2

## 2021-01-03 MED ORDER — NITROGLYCERIN 0.4 MG SL SUBL
0.8000 mg | SUBLINGUAL_TABLET | Freq: Once | SUBLINGUAL | Status: AC
  Administered 2021-01-03: 0.8 mg via SUBLINGUAL

## 2021-01-03 MED ORDER — IOHEXOL 350 MG/ML SOLN
95.0000 mL | Freq: Once | INTRAVENOUS | Status: AC | PRN
  Administered 2021-01-03: 95 mL via INTRAVENOUS

## 2021-03-09 ENCOUNTER — Encounter: Payer: Self-pay | Admitting: Gastroenterology

## 2021-04-23 ENCOUNTER — Other Ambulatory Visit: Payer: Self-pay | Admitting: Dermatology

## 2021-04-28 ENCOUNTER — Other Ambulatory Visit: Payer: Self-pay

## 2021-04-28 ENCOUNTER — Ambulatory Visit (AMBULATORY_SURGERY_CENTER): Payer: Medicare Other | Admitting: *Deleted

## 2021-04-28 ENCOUNTER — Telehealth: Payer: Self-pay | Admitting: *Deleted

## 2021-04-28 VITALS — Ht 74.0 in | Wt 188.0 lb

## 2021-04-28 DIAGNOSIS — Z86010 Personal history of colonic polyps: Secondary | ICD-10-CM

## 2021-04-28 MED ORDER — PEG 3350-KCL-NA BICARB-NACL 420 G PO SOLR: 4000.0000 mL | Freq: Once | ORAL | 0 refills | Status: AC

## 2021-04-28 NOTE — Telephone Encounter (Signed)
SO, does he need to be a hospital case or is [facility] ok?

## 2021-04-28 NOTE — Telephone Encounter (Signed)
Call [phone] Pt's wife

## 2021-04-28 NOTE — Progress Notes (Signed)
No egg or soy allergy known to patient  issues known to pt with past sedation with any surgeries or procedures- PONV  Patient denies ever being told they had issues or difficulty with intubation  No FH of Malignant Hyperthermia Pt is not on diet pills Pt is not on  home 02  Pt is not on blood thinners  Pt denies issues with constipation  No A fib or A flutter  Pt is not vaccinated  for Covid   NO PA's for preps discussed with pt In PV today  Discussed with pt there will be an out-of-pocket cost for prep and that varies from $0 to 70 +  dollars - pt verbalized understanding   Due to the COVID-19 pandemic we are asking patients to follow certain guidelines in PV and the [place]   Pt aware of COVID protocols and [facility] guidelines  PV completed over the phone. Pt verified name, DOB, address and insurance during PV today.  Pt mailed instruction packet with copy of consent form to read and not return, and instructions.    Pt encouraged to call with questions or issues.  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

## 2021-04-28 NOTE — Telephone Encounter (Signed)
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  I cannot find any surgical notes about difficult intubation in the past- he has had several colonoscopies here in the [place] with no issues-   Can you please call this pt and discuss with him and advise if he needs to be a hospital case or if he is ok for [facility].   [name] PV

## 2021-04-28 NOTE — Telephone Encounter (Signed)
Spoke with Osvaldo Angst, CRNa- he states after speaking with pt, he is cleared for anesthetic care at [facility]  as pt has jad 4 surgeries with no issues, no documented difficult intubation   [name] PV

## 2021-05-03 ENCOUNTER — Other Ambulatory Visit: Payer: Self-pay

## 2021-05-03 ENCOUNTER — Ambulatory Visit (INDEPENDENT_AMBULATORY_CARE_PROVIDER_SITE_OTHER): Payer: Medicare Other | Admitting: Dermatology

## 2021-05-03 ENCOUNTER — Encounter: Payer: Self-pay | Admitting: Dermatology

## 2021-05-03 DIAGNOSIS — L821 Other seborrheic keratosis: Secondary | ICD-10-CM | POA: Diagnosis not present

## 2021-05-03 DIAGNOSIS — R21 Rash and other nonspecific skin eruption: Secondary | ICD-10-CM | POA: Diagnosis not present

## 2021-05-03 DIAGNOSIS — I872 Venous insufficiency (chronic) (peripheral): Secondary | ICD-10-CM | POA: Diagnosis not present

## 2021-05-03 DIAGNOSIS — L309 Dermatitis, unspecified: Secondary | ICD-10-CM | POA: Diagnosis not present

## 2021-05-03 DIAGNOSIS — L570 Actinic keratosis: Secondary | ICD-10-CM

## 2021-05-03 DIAGNOSIS — Z1283 Encounter for screening for malignant neoplasm of skin: Secondary | ICD-10-CM

## 2021-05-03 MED ORDER — HYDROCORTISONE 2.5 % EX OINT: TOPICAL_OINTMENT | CUTANEOUS | 11 refills | Status: AC

## 2021-05-03 MED ORDER — TRIAMCINOLONE ACETONIDE 0.1 % EX CREA: 1.0000 "application " | TOPICAL_CREAM | Freq: Every day | CUTANEOUS | 11 refills | Status: AC

## 2021-05-12 ENCOUNTER — Ambulatory Visit (AMBULATORY_SURGERY_CENTER): Payer: Medicare Other | Admitting: Gastroenterology

## 2021-05-12 ENCOUNTER — Other Ambulatory Visit: Payer: Self-pay

## 2021-05-12 ENCOUNTER — Encounter: Payer: Self-pay | Admitting: Gastroenterology

## 2021-05-12 ENCOUNTER — Telehealth: Payer: Self-pay | Admitting: Gastroenterology

## 2021-05-12 VITALS — BP 116/53 | HR 61 | Temp 98.6°F | Resp 29 | Ht 74.5 in | Wt 188.0 lb

## 2021-05-12 DIAGNOSIS — Z86010 Personal history of colonic polyps: Secondary | ICD-10-CM

## 2021-05-12 MED ORDER — SODIUM CHLORIDE 0.9 % IV SOLN: 500.0000 mL | Freq: Once | INTRAVENOUS | Status: DC

## 2021-05-12 NOTE — Progress Notes (Signed)
History & Physical  Primary Care Physician:  Velna Hatchet, MD Primary Gastroenterologist: Lucio Edward, MD  CHIEF COMPLAINT:  Personal history of colon polyps   HPI: DAYQUAN BUYS is a 71 y.o. male with a personal history of sessile serrated polyps 5 years ago for surveillance colonoscopy.  Remote history of adenomatous colon polyps.   Past Medical History:  Diagnosis Date   Benign prostatic hypertrophy    Diverticulosis    Esophageal abnormality    pt states he has a "kink" in his esophagus - Told may cause intubation issues   Hearing loss in left ear    Heart murmur    slight   History of mitral valve prolapse    ( clinically NO Mitral regurgitation)    Hyperlipidemia    Hypertension    Hypothyroidism    OSA (obstructive sleep apnea)    [facility]   PONV (postoperative nausea and vomiting)    Sleep apnea    wears cpap   Tubular adenoma of colon 01/2008   Dr Fuller Plan    Past Surgical History:  Procedure Laterality Date   COLONOSCOPY  04/25/2011   negative   COLONOSCOPY W/ POLYPECTOMY  04/25/2007   Tubular Adenoma , Dr.Honesti Seaberg   FLEXIBLE SIGMOIDOSCOPY  04/24/1998   For rectal bleeding    HERNIA REPAIR     INNER EAR SURGERY     x2   KNEE ARTHROSCOPY     Right   mastoid surg     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 Authorizing Provider  aspirin 81 MG tablet Take 81 mg by mouth daily.   Yes [provider]  Cholecalciferol (VITAMIN D3) 2000 UNITS TABS Take 2 tablets by mouth daily.   Yes [provider]  Coenzyme Q10 (COQ10) 100 MG CAPS Take 1 capsule by mouth daily.   Yes [provider]  doxazosin (CARDURA) 8 MG tablet TAKE ONE-FOURTH TABLET BY MOUTH EVERY DAY 03/10/13  Yes Hendricks Limes, MD  levothyroxine (SYNTHROID,  LEVOTHROID) 25 MCG tablet TAKE ONE AND ONE-HALF TABLETS BY MOUTH ONCE DAILY,EXCEPT TAKE 1 TABLET BY MOUTH ON [facility] Patient taking differently: 1 /12 tablets daily 03/23/14  Yes Hendricks Limes, MD  Omega-3 Fatty Acids (FISH OIL) 1000 MG CAPS Take 1 capsule by mouth daily.   Yes [provider]  pravastatin (PRAVACHOL) 80 MG tablet Take 1 tablet (80 mg total) by mouth daily. 12/10/20  Yes Croitoru, Mihai, MD  hydrocortisone 2.5 % ointment APPLY TO AFFECTED AREA AT BEDTIME AS DIRECTED 05/03/21   Lavonna Monarch, MD  Multiple Vitamins-Minerals (MENS 50+ MULTI VITAMIN/MIN) TABS Take 1 tablet by mouth daily. 12/23/13   [provider]  neomycin-polymyxin-hydrocortisone (CORTISPORIN) 3.5-10000-1 OTIC suspension neomycin-polymyxin-hydrocort 3.5 mg-10,000 unit/mL-1 % ear drops,susp Patient not taking: Reported on 04/28/2021    [provider]  nystatin-triamcinolone (MYCOLOG II) cream nystatin-triamcinolone 100,000 unit/g-0.1 % topical cream Patient not taking: Reported on 04/28/2021 08/08/19   [provider]  PRESCRIPTION MEDICATION Medication Name: CASH Otic Powder Mix the following powders together at these concentrations: 1. Ciprofloxacin                  30mg  2. Amphotericin B  5mg  3. Sulfacetamide Sodium  50mg  4. Hydrocortisone              25mg  Place mixed powders into #3 gelatin capsules. Keep capsules refrigerated. Dispense: #90 capsules Sig: Separate capsule. Place longer end of capsule into stem of OtoMed Powder Insufflator.        Administer 2 puffs of powder, in ear(s) with mastoid cavity twice weekly (Wed & Sat). 08/12/20   [provider]  Psyllium (QC NATURAL VEGETABLE) 95 % POWD Take by mouth.    [provider]  triamcinolone cream (KENALOG) 0.1 % Apply 1 application topically daily. 05/03/21   Lavonna Monarch, MD  Zinc Sulfate (ZINC 15 PO) Take 1 tablet by mouth once as needed.    [provider]    Current Outpatient  Medications  Medication Sig Dispense Refill   aspirin 81 MG tablet Take 81 mg by mouth daily.     Cholecalciferol (VITAMIN D3) 2000 UNITS TABS Take 2 tablets by mouth daily.     Coenzyme Q10 (COQ10) 100 MG CAPS Take 1 capsule by mouth daily.     doxazosin (CARDURA) 8 MG tablet TAKE ONE-FOURTH TABLET BY MOUTH EVERY DAY 90 tablet 0   levothyroxine (SYNTHROID, LEVOTHROID) 25 MCG tablet TAKE ONE AND ONE-HALF TABLETS BY MOUTH ONCE DAILY,EXCEPT TAKE 1 TABLET BY MOUTH ON WEDNESDAY (Patient taking differently: 1 /12 tablets daily) 120 tablet 0   Omega-3 Fatty Acids (FISH OIL) 1000 MG CAPS Take 1 capsule by mouth daily.     pravastatin (PRAVACHOL) 80 MG tablet Take 1 tablet (80 mg total) by mouth daily. 90 tablet 3   hydrocortisone 2.5 % ointment APPLY TO AFFECTED AREA AT BEDTIME AS DIRECTED 20 g 11   Multiple Vitamins-Minerals (MENS 50+ MULTI VITAMIN/MIN) TABS Take 1 tablet by mouth daily.     neomycin-polymyxin-hydrocortisone (CORTISPORIN) 3.5-10000-1 OTIC suspension neomycin-polymyxin-hydrocort 3.5 mg-10,000 unit/mL-1 % ear drops,susp (Patient not taking: Reported on 04/28/2021)     nystatin-triamcinolone (MYCOLOG II) cream nystatin-triamcinolone 100,000 unit/g-0.1 % topical cream (Patient not taking: Reported on 04/28/2021)     PRESCRIPTION MEDICATION Medication Name: CASH Otic Powder Mix the following powders together at these concentrations: 1. Ciprofloxacin                  30mg  2. Amphotericin B               5mg  3. Sulfacetamide Sodium  50mg  4. Hydrocortisone              25mg  Place mixed powders into #3 gelatin capsules. Keep capsules refrigerated. Dispense: #90 capsules Sig: Separate capsule. Place longer end of capsule into stem of OtoMed Powder Insufflator.        Administer 2 puffs of powder, in ear(s) with mastoid cavity twice weekly (Wed & Sat).     Psyllium (QC NATURAL VEGETABLE) 95 % POWD Take by mouth.     triamcinolone cream (KENALOG) 0.1 % Apply 1 application topically daily. 80 g 11   Zinc  Sulfate (ZINC 15 PO) Take 1 tablet by mouth once as needed.     Current Facility-Administered Medications  Medication Dose Route Frequency Provider Last Rate Last Admin   0.9 %  sodium chloride infusion  500 mL Intravenous Once Ladene Artist, MD        Allergies as of 05/12/2021 - Review Complete 05/12/2021  Allergen Reaction Noted   Other  05/03/2021    Family History  Problem Relation Age of Onset   Tuberculosis Father  in college   Hypertension Mother    Coronary artery disease Maternal Uncle        died in sleep, > 23   Cancer Paternal Grandmother         ? primary   Colon cancer Maternal Uncle    Stroke Neg Hx    Colon polyps Neg Hx    Rectal cancer Neg Hx    Stomach cancer Neg Hx    Esophageal cancer Neg Hx     Social History   Socioeconomic History   Marital status: Married    Spouse name: Not on file   Number of children: 2   Years of education: Not on file   Highest education level: Not on file  Occupational History   Occupation: Retried    Employer: [employer]  Tobacco Use   Smoking status: Never   Smokeless tobacco: Never  Vaping Use   Vaping Use: Never used  Substance and Sexual Activity   Alcohol use: No   Drug use: No   Sexual activity: Not on file  Other Topics Concern   Not on file  Social History Narrative   Not on file   Social Determinants of Health   Financial Resource Strain: Not on file  Food Insecurity: Not on file  Transportation Needs: Not on file  Physical Activity: Not on file  Stress: Not on file  Social Connections: Not on file  Intimate Partner Violence: Not on file    Review of Systems:  All systems reviewed an negative except where noted in HPI.  Gen: Denies any fever, chills, sweats, anorexia, fatigue, weakness, malaise, weight loss, and sleep disorder CV: Denies chest pain, angina, palpitations, syncope, orthopnea, PND, peripheral edema, and claudication. Resp: Denies dyspnea at rest, dyspnea with  exercise, cough, sputum, wheezing, coughing up blood, and pleurisy. GI: Denies vomiting blood, jaundice, and fecal incontinence.   Denies dysphagia or odynophagia. GU : Denies urinary burning, blood in urine, urinary frequency, urinary hesitancy, nocturnal urination, and urinary incontinence. MS: Denies joint pain, limitation of movement, and swelling, stiffness, low back pain, extremity pain. Denies muscle weakness, cramps, atrophy.  Derm: Denies rash, itching, dry skin, hives, moles, warts, or unhealing ulcers.  Psych: Denies depression, anxiety, memory loss, suicidal ideation, hallucinations, paranoia, and confusion. Heme: Denies bruising, bleeding, and enlarged lymph nodes. Neuro:  Denies any headaches, dizziness, paresthesias. Endo:  Denies any problems with DM, thyroid, adrenal function.   Physical Exam: Vital signs in last 24 hours: General:  Alert, well-developed, in NAD Head:  Normocephalic and atraumatic. Eyes:  Sclera clear, no icterus.   Conjunctiva pink. Ears:  Normal auditory acuity. Mouth:  No deformity or lesions.  Neck:  Supple; no masses . Lungs:  Clear throughout to auscultation.   No wheezes, crackles, or rhonchi. No acute distress. Heart:  Regular rate and rhythm; no murmurs. Abdomen:  Soft, nondistended, nontender. No masses, hepatomegaly. No obvious masses.  Normal bowel .    Rectal:  Deferred   Msk:  Symmetrical without gross deformities.. Pulses:  Normal pulses noted. Extremities:  Without edema. Neurologic:  Alert and  oriented x4;  grossly normal neurologically. Skin:  Intact without significant lesions or rashes. Cervical Nodes:  No significant cervical adenopathy. Psych:  Alert and cooperative. Normal mood and affect.    Impression / Plan:   Personal history of sessile serrated polyps 5 years ago for surveillance colonoscopy. Remote history of adenomatous colon polyps.    Pricilla Riffle. Fuller Plan  05/12/2021, 10:03 AM See [facility], to contact our  on call provider

## 2021-05-12 NOTE — Op Note (Signed)
[facility] Patient Name: Mike Stephens Procedure Date: 05/12/2021 10:03 AM MRN: [id number] Endoscopist: Ladene Artist , MD Age: 71 Referring MD:  Date of Birth: [birth year] Gender: Male Account #: [id number] Procedure:                Colonoscopy Indications:              High risk colon cancer surveillance: Personal                            history of sessile serrated colon polyps (less than                            10 mm in size) with no dysplasia Medicines:                Monitored Anesthesia Care Procedure:                Pre-Anesthesia Assessment:                           - Prior to the procedure, a History and Physical                            was performed, and patient medications and                            allergies were reviewed. The patient's tolerance of                            previous anesthesia was also reviewed. The risks                            and benefits of the procedure and the sedation                            options and risks were discussed with the patient.                            All questions were answered, and informed consent                            was obtained. Prior Anticoagulants: The patient has                            taken no previous anticoagulant or antiplatelet                            agents. ASA Grade Assessment: II - A patient with                            mild systemic disease. After reviewing the risks                            and benefits, the patient was deemed in  satisfactory condition to undergo the procedure.                           After obtaining informed consent, the colonoscope                            was passed under direct vision. Throughout the                            procedure, the patient's blood pressure, pulse, and                            oxygen saturations were monitored continuously. The                            CF [id number] #[id number] was  introduced through the anus                            and advanced to the the cecum, identified by                            appendiceal orifice and ileocecal valve. The                            ileocecal valve, appendiceal orifice, and rectum                            were photographed. The quality of the bowel                            preparation was good. The colonoscopy was performed                            without difficulty. The patient tolerated the                            procedure well. Scope In: 10:09:17 AM Scope Out: 10:25:59 AM Scope Withdrawal Time: 0 hours 11 minutes 28 seconds  Total Procedure Duration: 0 hours 16 minutes 42 seconds  Findings:                 The perianal and digital rectal examinations were                            normal.                           Multiple medium-mouthed diverticula were found in                            the left colon. There was no evidence of                            diverticular bleeding.  Internal hemorrhoids were found during                            retroflexion. The hemorrhoids were small and Grade                            I (internal hemorrhoids that do not prolapse).                           The exam was otherwise without abnormality on                            direct and retroflexion views. Complications:            No immediate complications. Estimated blood loss:                            None. Estimated Blood Loss:     Estimated blood loss: none. Impression:               - Mild diverticulosis in the left colon.                           - Internal hemorrhoids.                           - The examination was otherwise normal on direct                            and retroflexion views.                           - No specimens collected. Recommendation:           - Repeat colonoscopy in 7 years for surveillance.                           - Patient has a contact number  available for                            emergencies. The signs and symptoms of potential                            delayed complications were discussed with the                            patient. Return to normal activities tomorrow.                            Written discharge instructions were provided to the                            patient.                           - High fiber diet.                           -  Continue present medications. Ladene Artist, MD 05/12/2021 10:31:16 AM This report has been signed electronically.

## 2021-05-12 NOTE — Telephone Encounter (Signed)
Inbound call from patient states he had procedure today. Since getting home he is still bloated, took two hours to pass gas and feel uncomfortable. Would like a call back to discuss what he can take to help

## 2021-05-12 NOTE — Progress Notes (Signed)
PT taken to PACU. Monitors in place. VSS. Report given to RN. 

## 2021-05-12 NOTE — Progress Notes (Signed)
Pt's states no medical or surgical changes since previsit or office visit. 

## 2021-05-12 NOTE — Patient Instructions (Addendum)
HANDOUTS GIVEN FOR DIVERTICULOSIS AND HIGH FIBER DIET.  YOU HAD AN ENDOSCOPIC PROCEDURE TODAY AT [facility]:   Refer to the procedure report that was given to you for any specific questions about what was found during the examination.  If the procedure report does not answer your questions, please call your gastroenterologist to clarify.  If you requested that your care partner not be given the details of your procedure findings, then the procedure report has been included in a sealed envelope for you to review at your convenience later.  YOU SHOULD EXPECT: Some feelings of bloating in the abdomen. Passage of more gas than usual.  Walking can help get rid of the air that was put into your GI tract during the procedure and reduce the bloating. If you had a lower endoscopy (such as a colonoscopy or flexible sigmoidoscopy) you may notice spotting of blood in your stool or on the toilet paper. If you underwent a bowel prep for your procedure, you may not have a normal bowel movement for a few days.  Please Note:  You might notice some irritation and congestion in your nose or some drainage.  This is from the oxygen used during your procedure.  There is no need for concern and it should clear up in a day or so.  SYMPTOMS TO REPORT IMMEDIATELY:  Following lower endoscopy (colonoscopy):  Excessive amounts of blood in the stool  Significant tenderness or worsening of abdominal pains  Swelling of the abdomen that is new, acute  Fever of 100F or higher  For urgent or emergent issues, a gastroenterologist can be reached at any hour by calling [phone]. Do not use MyChart messaging for urgent concerns.    DIET:  We do recommend a small meal at first, but then you may proceed to your regular diet.  Drink plenty of fluids but you should avoid alcoholic beverages for 24 hours.  ACTIVITY:  You should plan to take it easy for the rest of today, MOVE SLOWLY and you should NOT DRIVE, WORK  or use heavy machinery until tomorrow (because of the sedation medicines used during the test).    FOLLOW UP: Our staff will call the number listed on your records Monday 05/16/21 following your procedure to check on you and address any questions or concerns that you may have regarding the information given to you following your procedure. If we do not reach you, we will leave a message.  We will attempt to reach you two times.  During this call, we will ask if you have developed any symptoms of COVID 19. If you develop any symptoms (ie: fever, flu-like symptoms, shortness of breath, cough etc.) before then, please call [phone].  If you test positive for Covid 19 in the 2 weeks post procedure, please call and report this information to [place].    There were no polyps seen today!  You will need another screening colonoscopy in 7 years, you will receive a letter at that time when you are due for the procedure.   Please call us at [phone] if you have a change in bowel habits, change in family history of colo-rectal cancer, rectal bleeding or other GI concern before that time.    SIGNATURES/CONFIDENTIALITY: You and/or your care partner have signed paperwork which will be entered into your electronic medical record.  These signatures attest to the fact that that the information above on your After Visit Summary has been reviewed and is understood.  Full responsibility of the confidentiality of this discharge information lies with you and/or your care-partner.

## 2021-05-12 NOTE — Telephone Encounter (Signed)
Pt states, "I have passed air twice and belched but still feel bloated."  He states his abdomen is soft but he just feels "full and bloated."  He has taken Gas-X  I suggested that he drink warm fluids, sit on the toilet, and walk around his home to see if he can pass more air.  Understanding voiced and he will call back if he has issues

## 2021-05-16 ENCOUNTER — Telehealth: Payer: Self-pay

## 2021-05-16 NOTE — Telephone Encounter (Signed)
Called [phone] and left a message we tried to reach pt for a follow up call. maw

## 2021-05-16 NOTE — Telephone Encounter (Signed)
°  Follow up Call-  Call back number 05/12/2021  Post procedure Call Back phone  # [phone]  Permission to leave phone message Yes  Some recent data might be hidden     Patient questions:  Do you have a fever, pain , or abdominal swelling? No. Pain Score  0 *  Have you tolerated food without any problems? Yes.    Have you been able to return to your normal activities? Yes.    Do you have any questions about your discharge instructions: Diet   No. Medications  No. Follow up visit  No.  Do you have questions or concerns about your Care? No.  Actions: * If pain score is 4 or above: No action needed, pain <4. Spoke with patient's wife

## 2021-05-17 DIAGNOSIS — E039 Hypothyroidism, unspecified: Secondary | ICD-10-CM | POA: Diagnosis not present

## 2021-05-17 DIAGNOSIS — E785 Hyperlipidemia, unspecified: Secondary | ICD-10-CM | POA: Diagnosis not present

## 2021-05-17 DIAGNOSIS — Z125 Encounter for screening for malignant neoplasm of prostate: Secondary | ICD-10-CM | POA: Diagnosis not present

## 2021-05-19 ENCOUNTER — Encounter: Payer: Self-pay | Admitting: Gastroenterology

## 2021-05-24 DIAGNOSIS — N401 Enlarged prostate with lower urinary tract symptoms: Secondary | ICD-10-CM | POA: Diagnosis not present

## 2021-05-24 DIAGNOSIS — E785 Hyperlipidemia, unspecified: Secondary | ICD-10-CM | POA: Diagnosis not present

## 2021-05-24 DIAGNOSIS — Z1339 Encounter for screening examination for other mental health and behavioral disorders: Secondary | ICD-10-CM | POA: Diagnosis not present

## 2021-05-24 DIAGNOSIS — R911 Solitary pulmonary nodule: Secondary | ICD-10-CM | POA: Diagnosis not present

## 2021-05-24 DIAGNOSIS — Z1331 Encounter for screening for depression: Secondary | ICD-10-CM | POA: Diagnosis not present

## 2021-05-24 DIAGNOSIS — K219 Gastro-esophageal reflux disease without esophagitis: Secondary | ICD-10-CM | POA: Diagnosis not present

## 2021-05-24 DIAGNOSIS — I700 Atherosclerosis of aorta: Secondary | ICD-10-CM | POA: Diagnosis not present

## 2021-05-24 DIAGNOSIS — E039 Hypothyroidism, unspecified: Secondary | ICD-10-CM | POA: Diagnosis not present

## 2021-05-24 DIAGNOSIS — R590 Localized enlarged lymph nodes: Secondary | ICD-10-CM | POA: Diagnosis not present

## 2021-05-24 DIAGNOSIS — Z0000 Encounter for general adult medical examination without abnormal findings: Secondary | ICD-10-CM | POA: Diagnosis not present

## 2021-05-26 ENCOUNTER — Encounter: Payer: Self-pay | Admitting: Dermatology

## 2021-05-26 NOTE — Progress Notes (Signed)
° °  Follow-Up Visit   Subjective  Mike Stephens is a 71 y.o. male who presents for the following: Annual Exam (Patient here today for yearly skin check. Per patient he has a lesions that scale on his scalp, itching both eyebrows x 1 month patient hasn't tried anything to help with the itching. Scale forehead and under left eye, lesion under left eye. Red lesion on left cheek x months using hydrocortisone 2% not helping, itching both sides of beard x 2-3 months no treatment. Lesion on right wrist x months per patient it's a sore no bleeding.  Patient would also like a refill on his triamcinolone for legs. ).  General skin examination, several areas of concern to patient Location:  Duration:  Quality:  Associated Signs/Symptoms: Modifying Factors:  Severity:  Timing: Context:   Objective  Well appearing patient in no apparent distress; mood and affect are within normal limits. General skin examination, no atypical pigmented lesions or nonmelanoma skin cancer.  Left Buccal Cheek, Left Thigh - Anterior, Right Buccal Cheek, Right Thigh - Anterior On subtle patchy facial dermatitis, not exclusively central facial so differential includes variants of adult eczema plus contact dermatitis.  Left Lower Back, Left Malar Cheek Tan textured flattopped 5 mm papules  Right Wrist - Posterior Hornlike 5 mm pink crust  Left Lower Leg - Anterior Mild edema, hemosiderosis, inflammation compatible with stasis dermatitis    A full examination was performed including scalp, head, eyes, ears, nose, lips, neck, chest, axillae, abdomen, back, buttocks, bilateral upper extremities, bilateral lower extremities, hands, feet, fingers, toes, fingernails, and toenails. All findings within normal limits unless otherwise noted below.   Assessment & Plan    Rash Left Thigh - Anterior; Right Thigh - Anterior; Left Buccal Cheek; Right Buccal Cheek  Topical Hytone ointment nightly for 2 to 4 weeks; if no  improvement will consider patch testing or biopsy.  triamcinolone cream (KENALOG) 0.1 % - Left Buccal Cheek, Left Thigh - Anterior, Right Buccal Cheek, Right Thigh - Anterior Apply 1 application topically daily.  Seborrheic keratosis (2) Left Lower Back; Left Malar Cheek  Leave if stable  AK (actinic keratosis) Right Wrist - Posterior  Destruction of lesion - Right Wrist - Posterior Complexity: simple   Destruction method: cryotherapy   Informed consent: discussed and consent obtained   Lesion destroyed using liquid nitrogen: Yes   Cryotherapy cycles:  3 Outcome: patient tolerated procedure well with no complications    Skin exam for malignant neoplasm  Annual skin examination.  Encouraged to self examine with spouse twice annually.  Dermatitis  Related Medications hydrocortisone 2.5 % ointment APPLY TO AFFECTED AREA AT BEDTIME AS DIRECTED  Venous stasis dermatitis of right lower extremity Right Lower Leg - Anterior  Venous stasis dermatitis of left lower extremity Left Lower Leg - Anterior  Discussed ways to minimize venous pressure.  May continue to use topical triamcinolone as needed for flares.      I, Lavonna Monarch, MD, have reviewed all documentation for this visit.  The documentation on 05/26/21 for the exam, diagnosis, procedures, and orders are all accurate and complete.

## 2021-06-30 DIAGNOSIS — E785 Hyperlipidemia, unspecified: Secondary | ICD-10-CM | POA: Diagnosis not present

## 2021-06-30 DIAGNOSIS — L309 Dermatitis, unspecified: Secondary | ICD-10-CM | POA: Diagnosis not present

## 2021-06-30 DIAGNOSIS — E039 Hypothyroidism, unspecified: Secondary | ICD-10-CM | POA: Diagnosis not present

## 2021-06-30 DIAGNOSIS — I700 Atherosclerosis of aorta: Secondary | ICD-10-CM | POA: Diagnosis not present

## 2021-06-30 DIAGNOSIS — I872 Venous insufficiency (chronic) (peripheral): Secondary | ICD-10-CM | POA: Diagnosis not present

## 2021-06-30 DIAGNOSIS — L2082 Flexural eczema: Secondary | ICD-10-CM | POA: Diagnosis not present

## 2021-08-01 DIAGNOSIS — Z638 Other specified problems related to primary support group: Secondary | ICD-10-CM | POA: Diagnosis not present

## 2021-08-01 DIAGNOSIS — R030 Elevated blood-pressure reading, without diagnosis of hypertension: Secondary | ICD-10-CM | POA: Diagnosis not present

## 2021-08-01 DIAGNOSIS — Z658 Other specified problems related to psychosocial circumstances: Secondary | ICD-10-CM | POA: Diagnosis not present

## 2021-08-14 IMAGING — CT CT ABD-PELV W/ CM
2 of 5 series · 16 of 46 positions shown, 18 images · IV contrast (omnipaque)
Comparison: None.

CLINICAL DATA: Abdominal pain and bloody stools.

EXAM:
CT ABDOMEN AND PELVIS WITH CONTRAST
TECHNIQUE: Multidetector CT imaging of the abdomen and pelvis was performed
using the standard protocol following bolus administration of
intravenous contrast.
CONTRAST:  100mL OMNIPAQUE IOHEXOL 300 MG/ML  SOLN

[Series 2: axial st · axial · 0.87mm/px · z∈[+1296,+1716]mm · 13 of 98 slices shown, 15 images]
[im 7/98  soft-tissue]
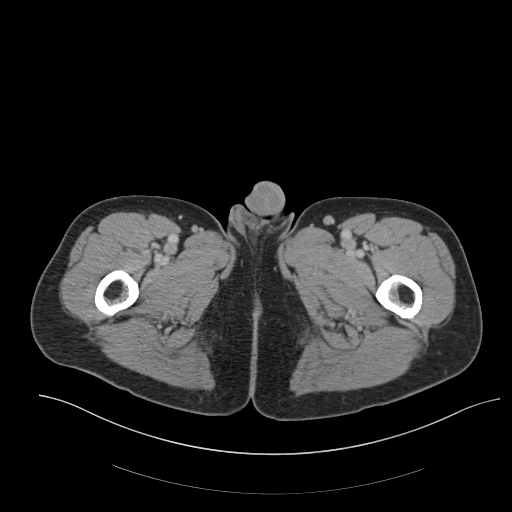
[im 7/98  bone]
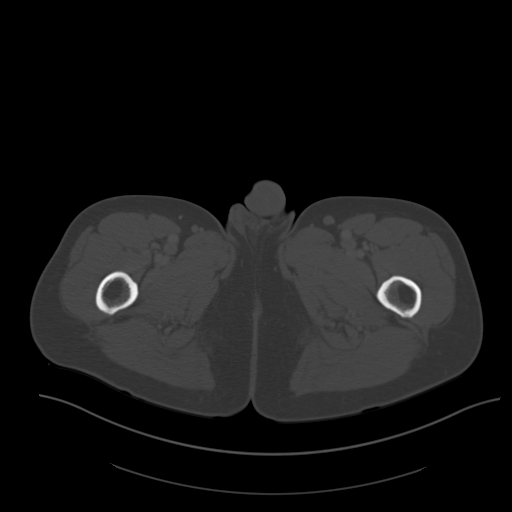
[im 14/98  soft-tissue]
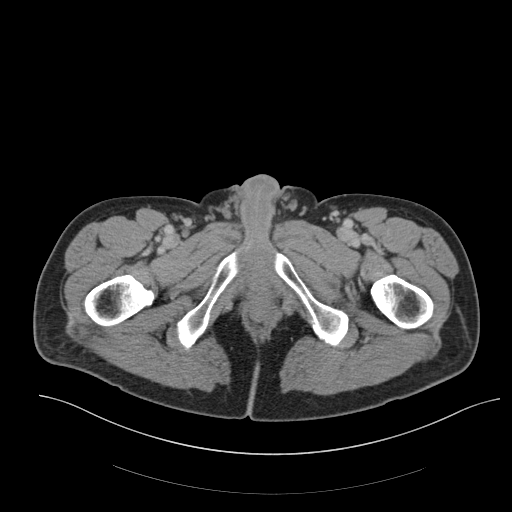
[im 21/98  soft-tissue]
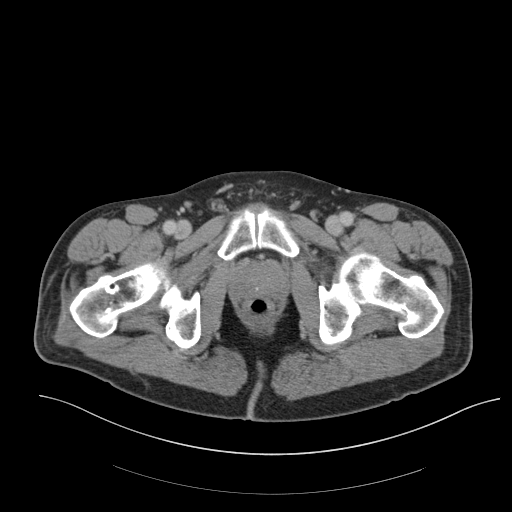
[im 28/98  soft-tissue]
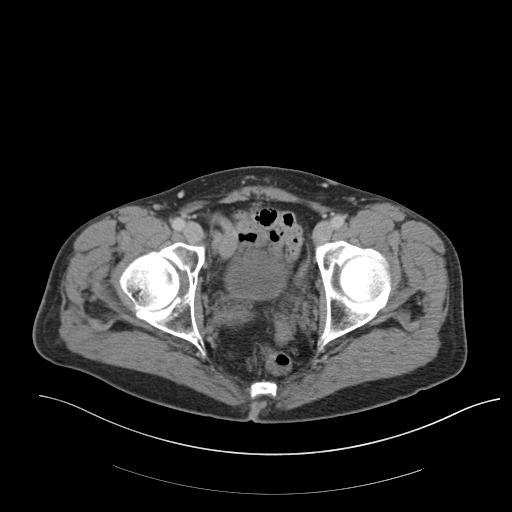
[im 35/98  soft-tissue]
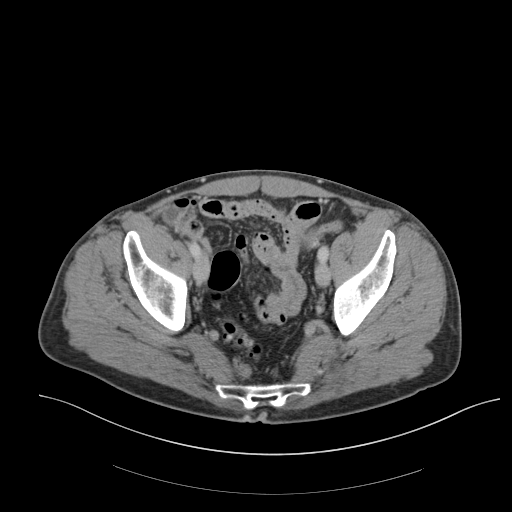
[im 42/98  soft-tissue]
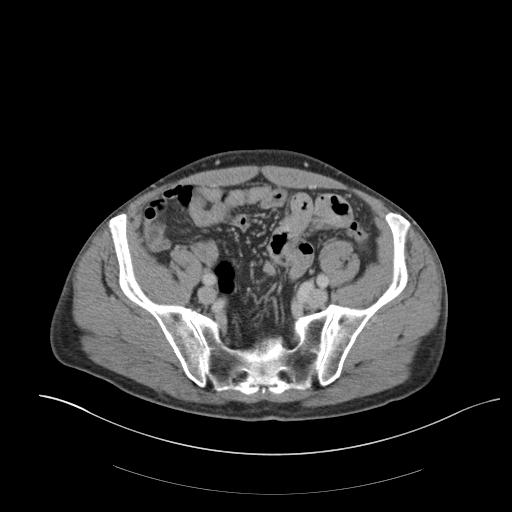
[im 49/98  soft-tissue]
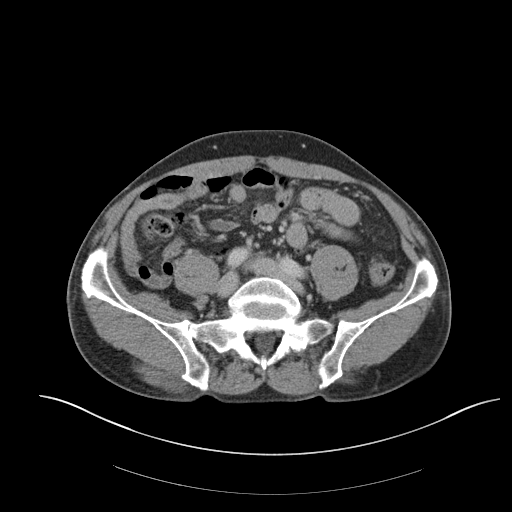
[im 56/98  soft-tissue]
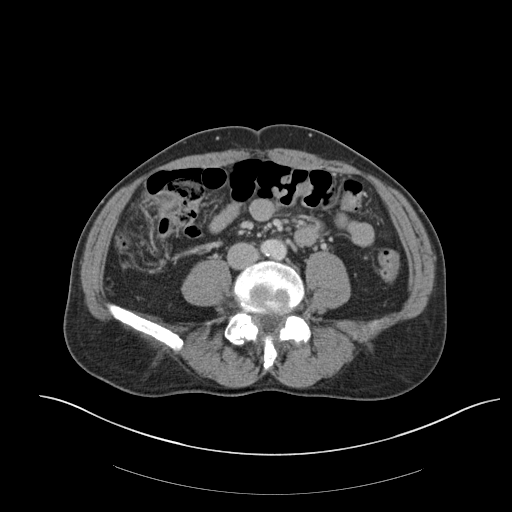
[im 63/98  soft-tissue]
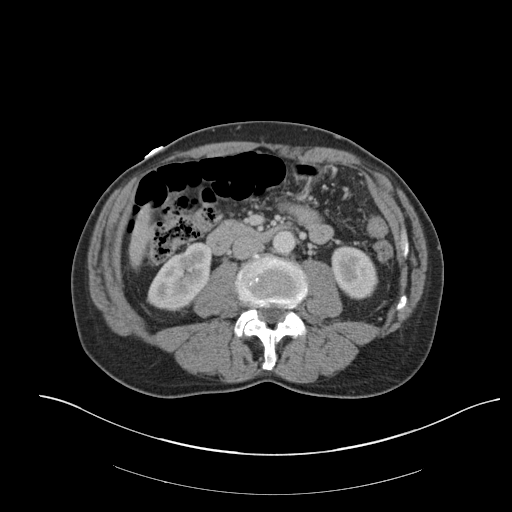
[im 63/98  bone]
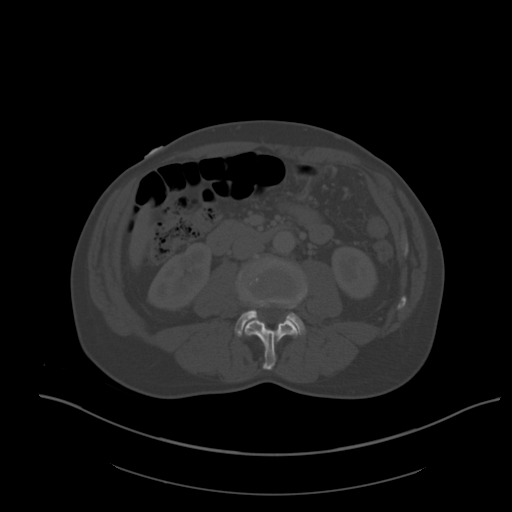
[im 70/98  soft-tissue]
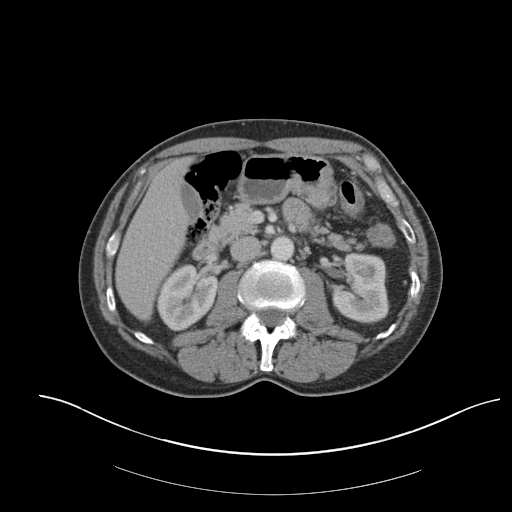
[im 77/98  soft-tissue]
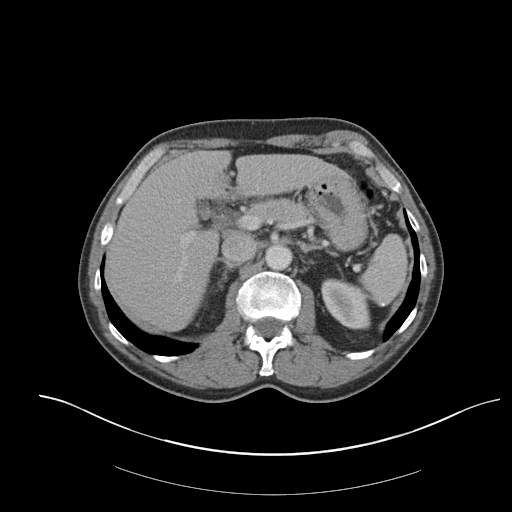
[im 84/98  soft-tissue]
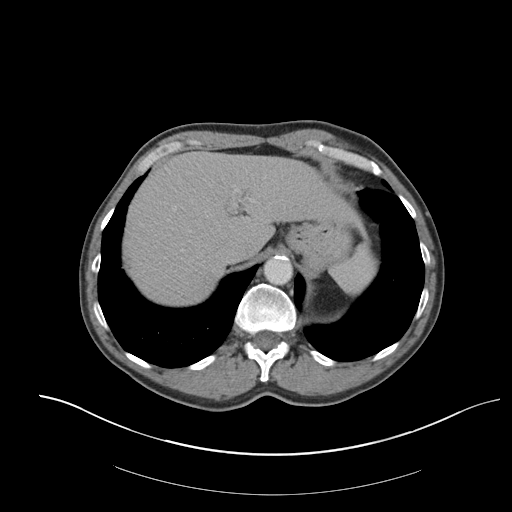
[im 91/98  soft-tissue]
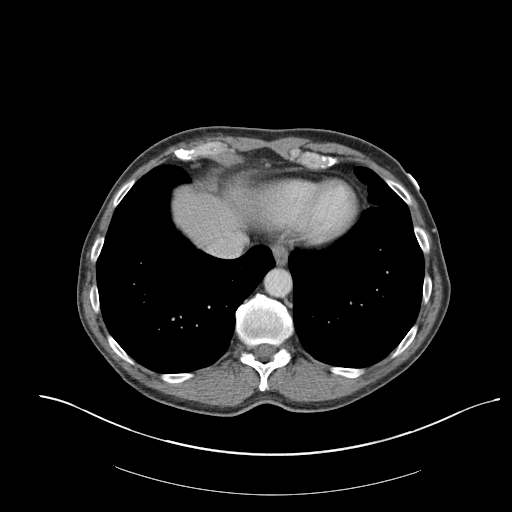

[Series 4: coronal st · coronal · 0.92mm/px · 3 of 128 slices shown]
[im 43/128  soft-tissue]
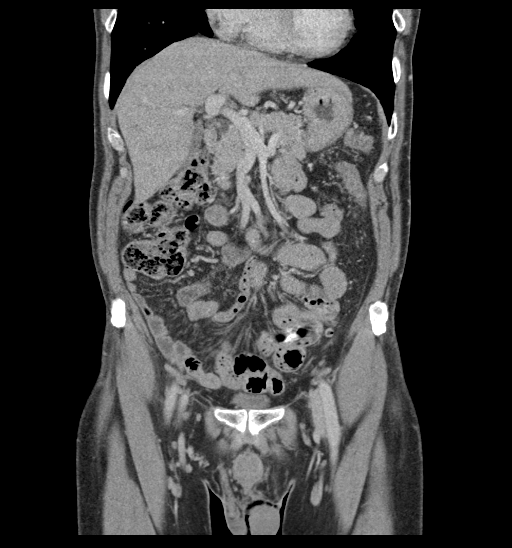
[im 57/128  soft-tissue]
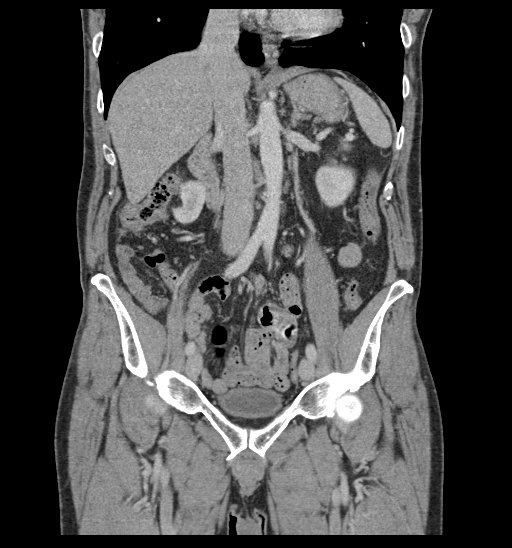
[im 71/128  soft-tissue]
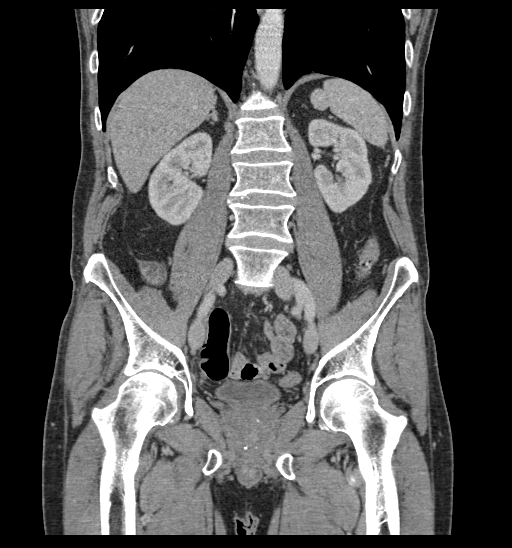

[16 of 46 positions shown; findings below may reference images not displayed]

FINDINGS: Lower chest: The lung bases are clear of acute process. No pleural
effusion or pulmonary lesions. Calcified granuloma noted at the
right lung base along with some calcified scarring changes. The
heart is normal in size. No pericardial effusion. The distal
esophagus and aorta are unremarkable.

Hepatobiliary: No focal hepatic lesions or intrahepatic biliary
dilatation. Gallbladder is normal. No common bile duct dilatation.

Pancreas: No mass, inflammation or ductal dilatation.

Spleen: Normal size.  No focal lesions.

Adrenals/Urinary Tract: The adrenal glands and kidneys are
unremarkable. No worrisome renal or bladder lesions. Small lower
pole left renal calculus is noted.

Stomach/Bowel: The stomach, duodenum, small bowel and colon are
grossly normal without oral contrast. No acute inflammatory changes,
mass lesions or obstructive findings. The terminal ileum is normal.
The appendix is not identified for certain but no findings to
suggest acute appendicitis. Advanced sigmoid colon diverticulosis
but no findings for acute diverticulitis.

Vascular/Lymphatic: Scattered atherosclerotic calcifications
involving the aorta but no aneurysm or dissection. The branch
vessels are patent. The major venous structures are patent. No
mesenteric or retroperitoneal mass or adenopathy. Small scattered
lymph nodes are noted.

Reproductive: The prostate gland and seminal vesicles are
unremarkable.

Other: No pelvic mass or adenopathy. No free pelvic fluid
collections. No inguinal mass or adenopathy. No abdominal wall
hernia or subcutaneous lesions.

Musculoskeletal: No significant bony findings.
IMPRESSION: 1. No acute abdominal/pelvic findings, mass lesions or adenopathy.
2. Sigmoid colon diverticulosis but no findings for acute
diverticulitis.

## 2021-08-19 DIAGNOSIS — Z9889 Other specified postprocedural states: Secondary | ICD-10-CM | POA: Diagnosis not present

## 2021-08-19 DIAGNOSIS — H7492 Unspecified disorder of left middle ear and mastoid: Secondary | ICD-10-CM | POA: Diagnosis not present

## 2021-08-19 DIAGNOSIS — H90A32 Mixed conductive and sensorineural hearing loss, unilateral, left ear with restricted hearing on the contralateral side: Secondary | ICD-10-CM | POA: Diagnosis not present

## 2021-08-19 DIAGNOSIS — H7291 Unspecified perforation of tympanic membrane, right ear: Secondary | ICD-10-CM | POA: Diagnosis not present

## 2021-08-19 DIAGNOSIS — Z9621 Cochlear implant status: Secondary | ICD-10-CM | POA: Diagnosis not present

## 2021-08-19 DIAGNOSIS — H90A21 Sensorineural hearing loss, unilateral, right ear, with restricted hearing on the contralateral side: Secondary | ICD-10-CM | POA: Diagnosis not present

## 2021-09-27 DIAGNOSIS — E785 Hyperlipidemia, unspecified: Secondary | ICD-10-CM | POA: Diagnosis not present

## 2021-09-27 DIAGNOSIS — E039 Hypothyroidism, unspecified: Secondary | ICD-10-CM | POA: Diagnosis not present

## 2021-10-13 DIAGNOSIS — H2513 Age-related nuclear cataract, bilateral: Secondary | ICD-10-CM | POA: Diagnosis not present

## 2021-10-13 DIAGNOSIS — H35452 Secondary pigmentary degeneration, left eye: Secondary | ICD-10-CM | POA: Diagnosis not present

## 2022-02-07 DIAGNOSIS — Z23 Encounter for immunization: Secondary | ICD-10-CM | POA: Diagnosis not present

## 2022-05-10 ENCOUNTER — Ambulatory Visit: Payer: Medicare Other | Admitting: Dermatology

## 2022-06-02 DIAGNOSIS — E785 Hyperlipidemia, unspecified: Secondary | ICD-10-CM | POA: Diagnosis not present

## 2022-06-02 DIAGNOSIS — K219 Gastro-esophageal reflux disease without esophagitis: Secondary | ICD-10-CM | POA: Diagnosis not present

## 2022-06-02 DIAGNOSIS — E039 Hypothyroidism, unspecified: Secondary | ICD-10-CM | POA: Diagnosis not present

## 2022-06-02 DIAGNOSIS — Z125 Encounter for screening for malignant neoplasm of prostate: Secondary | ICD-10-CM | POA: Diagnosis not present

## 2022-06-02 DIAGNOSIS — R030 Elevated blood-pressure reading, without diagnosis of hypertension: Secondary | ICD-10-CM | POA: Diagnosis not present

## 2022-06-09 DIAGNOSIS — Z1339 Encounter for screening examination for other mental health and behavioral disorders: Secondary | ICD-10-CM | POA: Diagnosis not present

## 2022-06-09 DIAGNOSIS — I700 Atherosclerosis of aorta: Secondary | ICD-10-CM | POA: Diagnosis not present

## 2022-06-09 DIAGNOSIS — Z1331 Encounter for screening for depression: Secondary | ICD-10-CM | POA: Diagnosis not present

## 2022-06-09 DIAGNOSIS — E785 Hyperlipidemia, unspecified: Secondary | ICD-10-CM | POA: Diagnosis not present

## 2022-06-09 DIAGNOSIS — L570 Actinic keratosis: Secondary | ICD-10-CM | POA: Diagnosis not present

## 2022-06-09 DIAGNOSIS — Z0000 Encounter for general adult medical examination without abnormal findings: Secondary | ICD-10-CM | POA: Diagnosis not present

## 2022-06-09 DIAGNOSIS — I2584 Coronary atherosclerosis due to calcified coronary lesion: Secondary | ICD-10-CM | POA: Diagnosis not present

## 2022-06-09 DIAGNOSIS — N401 Enlarged prostate with lower urinary tract symptoms: Secondary | ICD-10-CM | POA: Diagnosis not present

## 2022-06-09 DIAGNOSIS — R911 Solitary pulmonary nodule: Secondary | ICD-10-CM | POA: Diagnosis not present

## 2022-06-09 DIAGNOSIS — E039 Hypothyroidism, unspecified: Secondary | ICD-10-CM | POA: Diagnosis not present

## 2022-06-09 DIAGNOSIS — K219 Gastro-esophageal reflux disease without esophagitis: Secondary | ICD-10-CM | POA: Diagnosis not present

## 2022-06-09 DIAGNOSIS — R590 Localized enlarged lymph nodes: Secondary | ICD-10-CM | POA: Diagnosis not present

## 2022-06-09 DIAGNOSIS — R82998 Other abnormal findings in urine: Secondary | ICD-10-CM | POA: Diagnosis not present

## 2022-08-25 DIAGNOSIS — Z0110 Encounter for examination of ears and hearing without abnormal findings: Secondary | ICD-10-CM | POA: Diagnosis not present

## 2022-08-25 DIAGNOSIS — H90A32 Mixed conductive and sensorineural hearing loss, unilateral, left ear with restricted hearing on the contralateral side: Secondary | ICD-10-CM | POA: Diagnosis not present

## 2022-08-25 DIAGNOSIS — H7291 Unspecified perforation of tympanic membrane, right ear: Secondary | ICD-10-CM | POA: Diagnosis not present

## 2022-08-25 DIAGNOSIS — H7192 Unspecified cholesteatoma, left ear: Secondary | ICD-10-CM | POA: Diagnosis not present

## 2022-08-25 DIAGNOSIS — Z48810 Encounter for surgical aftercare following surgery on the sense organs: Secondary | ICD-10-CM | POA: Diagnosis not present

## 2022-08-25 DIAGNOSIS — H90A21 Sensorineural hearing loss, unilateral, right ear, with restricted hearing on the contralateral side: Secondary | ICD-10-CM | POA: Diagnosis not present

## 2022-08-25 DIAGNOSIS — Z9889 Other specified postprocedural states: Secondary | ICD-10-CM | POA: Diagnosis not present

## 2022-08-25 DIAGNOSIS — Z9621 Cochlear implant status: Secondary | ICD-10-CM | POA: Diagnosis not present

## 2022-08-25 DIAGNOSIS — H7492 Unspecified disorder of left middle ear and mastoid: Secondary | ICD-10-CM | POA: Diagnosis not present

## 2022-08-31 ENCOUNTER — Telehealth: Payer: Self-pay | Admitting: Internal Medicine

## 2022-08-31 DIAGNOSIS — R0602 Shortness of breath: Secondary | ICD-10-CM

## 2022-08-31 NOTE — Telephone Encounter (Signed)
Patient would like to schedule CT scan. Appointment with Dr, Marchelle Gearing. Patient phone number is [phone].

## 2022-09-01 NOTE — Telephone Encounter (Signed)
Patient checking on message sent for CT scan. Patient phone number is [phone].

## 2022-09-01 NOTE — Telephone Encounter (Signed)
Main thing would be to get a rpeat HRCT - for "abnormal CT " in past If there is dyspnea, then get PFT as well     Latest Ref Rng & Units 07/08/2019   10:50 AM  PFT Results  FVC-Pre L 5.16   FVC-Predicted Pre % 95   FVC-Post L 5.22   FVC-Predicted Post % 96   Pre FEV1/FVC % % 77   Post FEV1/FCV % % 78   FEV1-Pre L 3.99   FEV1-Predicted Pre % 99   FEV1-Post L 4.06   DLCO uncorrected ml/min/mmHg 28.72   DLCO UNC% % 93   DLCO corrected ml/min/mmHg 28.72   DLCO COR %Predicted % 93   DLVA Predicted % 106   TLC L 7.53   TLC % Predicted % 93   RV % Predicted % 100

## 2022-09-01 NOTE — Telephone Encounter (Signed)
Called patient.  Needed to schedule a yearly follow up with Dr. Marchelle Gearing.  Before patient schedules this, he wanted to know if Dr. Marchelle Gearing may want any tests done, like a CT chest or other pulmonary testing.  Patient would like to have all of these done BEFORE his OV with Dr. Marchelle Gearing to discuss results.  Patient stated he was told it was okay to wait between 1-2 years for follow up visit.  Dr. Marchelle Gearing:  Please advise. (Patient knows provider not in clinic this afternoon and will wait until 09/04/22 to get information and schedule the OV).

## 2022-09-04 NOTE — Telephone Encounter (Signed)
Called and spoke with patient. He verbalized understanding and wishes to proceed with the CT. Per patient, he will be unavailable until at least 05/27. I advised him that I would place this in the referral.   Order has been placed.   Nothing further needed at time of call.

## 2022-09-13 DIAGNOSIS — D1801 Hemangioma of skin and subcutaneous tissue: Secondary | ICD-10-CM | POA: Diagnosis not present

## 2022-09-13 DIAGNOSIS — D171 Benign lipomatous neoplasm of skin and subcutaneous tissue of trunk: Secondary | ICD-10-CM | POA: Diagnosis not present

## 2022-09-13 DIAGNOSIS — L570 Actinic keratosis: Secondary | ICD-10-CM | POA: Diagnosis not present

## 2022-09-13 DIAGNOSIS — D2271 Melanocytic nevi of right lower limb, including hip: Secondary | ICD-10-CM | POA: Diagnosis not present

## 2022-09-13 DIAGNOSIS — L2089 Other atopic dermatitis: Secondary | ICD-10-CM | POA: Diagnosis not present

## 2022-09-13 DIAGNOSIS — D225 Melanocytic nevi of trunk: Secondary | ICD-10-CM | POA: Diagnosis not present

## 2022-09-13 DIAGNOSIS — L820 Inflamed seborrheic keratosis: Secondary | ICD-10-CM | POA: Diagnosis not present

## 2022-09-13 DIAGNOSIS — D2272 Melanocytic nevi of left lower limb, including hip: Secondary | ICD-10-CM | POA: Diagnosis not present

## 2022-09-13 DIAGNOSIS — L821 Other seborrheic keratosis: Secondary | ICD-10-CM | POA: Diagnosis not present

## 2022-09-13 DIAGNOSIS — L218 Other seborrheic dermatitis: Secondary | ICD-10-CM | POA: Diagnosis not present

## 2022-09-27 ENCOUNTER — Ambulatory Visit
Admission: RE | Admit: 2022-09-27 | Discharge: 2022-09-27 | Disposition: A | Discharge status: Home / Self Care | Payer: Medicare Other | Source: Ambulatory Visit | Attending: Internal Medicine | Admitting: Internal Medicine

## 2022-09-27 DIAGNOSIS — R0609 Other forms of dyspnea: Secondary | ICD-10-CM | POA: Diagnosis not present

## 2022-09-27 DIAGNOSIS — R0602 Shortness of breath: Secondary | ICD-10-CM

## 2022-10-05 ENCOUNTER — Telehealth: Payer: Self-pay | Admitting: Internal Medicine

## 2022-10-05 NOTE — Telephone Encounter (Signed)
Wife calling. Saw CT results on line. CT said "benign" so does he still need an appt  Monday w/Dr. R? Please call wife to advise. [phone]

## 2022-10-06 NOTE — Telephone Encounter (Signed)
Dr. Marchelle Gearing please advise on CT results? Will patient need to keep apt Monday?

## 2022-10-06 NOTE — Telephone Encounter (Signed)
Pt wants to know if her husband needs to come in. Patient is scheduled for Monday.

## 2022-10-08 NOTE — Telephone Encounter (Signed)
   Lung nodules are stable and overall appearance is benign.  If he is feeling well then no need to come.  If he is never smoker [records indicate he never smoked] then no further CT scans are indicated.    reports that he has never smoked. He has never used smokeless tobacco.   Narrative & Impression  CLINICAL DATA:  72 year old male with history of dyspnea on exertion.   EXAM: CT CHEST WITHOUT CONTRAST   TECHNIQUE: Multidetector CT imaging of the chest was performed following the standard protocol without intravenous contrast. High resolution imaging of the lungs, as well as inspiratory and expiratory imaging, was performed.   RADIATION DOSE REDUCTION: This exam was performed according to the departmental dose-optimization program which includes automated exposure control, adjustment of the mA and/or kV according to patient size and/or use of iterative reconstruction technique.   COMPARISON:  Chest CT 09/27/2020.   FINDINGS: Cardiovascular: Heart size is normal. There is no significant pericardial fluid, thickening or pericardial calcification. There is aortic atherosclerosis, as well as atherosclerosis of the great vessels of the mediastinum and the coronary arteries, including calcified atherosclerotic plaque in the left anterior descending coronary artery.   Mediastinum/Nodes: No pathologically enlarged mediastinal or hilar lymph nodes. Please note that accurate exclusion of hilar adenopathy is limited on noncontrast CT scans. Several densely calcified right hilar and subcarinal lymph nodes are incidentally noted. Esophagus is unremarkable in appearance. No axillary lymphadenopathy.   Lungs/Pleura: Bilateral apical nodular pleuroparenchymal thickening and architectural distortion, similar to numerous prior examinations, most compatible with areas of chronic post infectious or inflammatory scarring. The largest of these nodules in the right upper lobe (axial image 41  of series 9) measures 9 x 4 mm (unchanged dating back to January 2021). Calcified granulomas are also noted in the right lower lobe. No other new suspicious appearing pulmonary nodules or masses are noted. No acute consolidative airspace disease. No pleural effusions. High-resolution images demonstrate no significant regions of ground-glass attenuation, septal thickening, subpleural reticulation, parenchymal banding, traction bronchiectasis or frank honeycombing to indicate interstitial lung disease. Inspiratory and expiratory imaging is unremarkable.   Upper Abdomen: Aortic atherosclerosis.   Musculoskeletal: There are no aggressive appearing lytic or blastic lesions noted in the visualized portions of the skeleton.   IMPRESSION: 1. No findings to suggest interstitial lung disease. 2. Stable chronic nodular pleuroparenchymal thickening in the lung apices similar to numerous prior examinations, considered definitively benign requiring no future imaging follow-up. 3. Old granulomatous disease, as above. 4. Aortic atherosclerosis, in addition to left anterior descending coronary artery disease. Please note that although the presence of coronary artery calcium documents the presence of coronary artery disease, the severity of this disease and any potential stenosis cannot be assessed on this non-gated CT examination. Assessment for potential risk factor modification, dietary therapy or pharmacologic therapy may be warranted, if clinically indicated.   Aortic Atherosclerosis (ICD10-I70.0).     Electronically Signed   By: Trudie Reed M.D.   On: 10/04/2022 08:24

## 2022-10-09 ENCOUNTER — Ambulatory Visit: Payer: Medicare Other | Admitting: Internal Medicine

## 2022-10-09 NOTE — Progress Notes (Deleted)
IOV 03/17/2019  Chief Complaint  Patient presents with   Consult    Referred by Dr. Link Snuffer due to asbestos exposure. Pt states he has an occ cough with occ clear phlegm that pt says he has had for years.       [facility] Integrated Comprehensive Questionnaire  Symptoms: 72 year old male reports a chronic history of shortness of breath and cough of mild to moderate intensity that is stable since its onset.  On the questionnaire he reports onset of dyspnea 1972.  He says several years ago he had a chest x-ray done by Dr. Alwyn Ren  who was then his primary care physician and there was some changes in the lung that were consistent with him living in the [place].  He says he grew up in [place].  He was also told at that time that the changes were consistent with him having birds in the house.  He said he had pet parakeets in the house.  He says he has mild to moderate dyspnea while climbing up mountains or doing hikes.  It is not necessarily worse.  He says he wants to get to the bottom of all this.  Shortness of breath made worse by exertion and relieved by rest.  Things like bending over and any mask over the face does make him more winded.  He does endorse a mild cough for the last 20 or 30 years since it started it is the same.  He does clear his throat a lot.  He just brings up some phlegm.  It is clear.  There is no hemoptysis or cough when he lies down or cough affecting his voice.  There is no tickle in the back of the throat.  SYMPTOM SCALE - ILD 03/17/2019   O2 use RA  Shortness of Breath 0 -> 5 scale with 5 being worst (score 6 If unable to do)  At rest 0  Simple tasks - showers, clothes change, eating, shaving 1  Household (dishes, doing bed, laundry) 1  Shopping 2  Walking level at own pace 2  Walking keeping up with others of same age 39  Walking up Stairs 4  Walking up [place] 5  Total (40 - 48) Dyspnea Score 17  How bad is your cough? mild  How bad is your  fatigue x        Past Medical History :   -Denies any asthma or COPD or heart failure rheumatoid arthritis or scleroderma or lupus or polymyositis or Sjogren's.  Denies any acid reflux or hiatal hernia.  Denies any immune system disorder.  Denies pulmonary hypertension.  Denies stroke denies seizures.  Denies hepatitis.  Denies tuberculosis.  Denies blood clots denies heart disease.  Several decades ago he had pleurisy.  He had pneumonia a few decades ago.  He has had thyroid issues for several years and sleep apnea for not otherwise specified.   ROS: Positive for fatigue and arthralgia in the hands and the back.  For several decades he does notice pain no color change white-red in the fingers with cold weather.  And he does snore for the few decades.  But denies any ulcers or rash or nausea vomiting or recurrent fever or dysphagia   FAMILY HISTORY of LUNG DISEASE: * -Denies pulmonary fibrosis or COPD or asthma sarcoidosis or cystic fibrosis or hypersensitivity pneumonitis or autoimmune disease.  His mother-in-law is my patient.   EXPOSURE HISTORY:  -Denies smoking any cigarettes or  cigars or pipe.  No marijuana use.  No vaping no cocaine no intravenous drug use.   HOME and HOBBY DETAILS :  -[place] last 32 years.  Age of the home is 46 years.  There is dampness in the laundry room with a leak.  There might be some mold or mildew in the shower curtains.  There is recent mold or mildew in the shower.  No humidifier use.  Does use CPAP but there is no mold in it.  Does not have a nebulizer machine.  No Jacuzzi use.  No misting Fountain inside the house.  He did have a pet bird between the age of 13-18 years.  He does use a feather pillow.  He does not know of any mold in the [facility] duct.  He does do gardening with compost, woodchips mulch and flowers.  Overall a lot of hypersensitive pneumonitis type of exposures.   OCCUPATIONAL HISTORY (122 questions) :  Positive asbestos exposure not otherwise specified.  He did have a bird as a child.  He does use feather pillow.  Did have a parakeet as a child.  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  Some woodwork at [employer] were rewiring at home.   PULMONARY TOXICITY HISTORY (27 items):  -Denies.  There is no imaging to look at.  Simple office walk 185 feet x  3 laps goal with forehead probe 03/17/2019   O2 used ra  Number laps completed 3  Comments about pace normal  Resting Pulse Ox/HR 100% and 62/min  Final Pulse Ox/HR 100% and 80/min  Desaturated </= 88% no  Desaturated <= 3% points no  Got Tachycardic >/= 90/min no  Symptoms at end of test Very mild if any dyspnea  Miscellaneous comments Normal test      03/22/2020  - Visit   72 year old male never smoker followed in our office by Dr. Marchelle Gearing for dyspnea on exertion.  Patient was last seen in our office in March/2021 by EW NP.  Plan of care from that office visit was as follows: Reviewed high-resolution CT chest that showed no evidence of ILD or spaces related pleural disease.  Some small scattered solid pulmonary nodule.  Pulmonary function testing was reviewed that showed no evidence of restriction or obstructive lung disease.  He was encouraged to remain on oral antihistamine as well as nasal steroid seasonally for his upper airway cough syndrome and continue follow-up with the ENT.  Patient was encouraged and scheduled to have this follow-up visit today at the request of primary care provider Dr. Link Snuffer.  Patient completed a CT angio of his chest on 03/17/2020.  She had a new 12 x 9 mm subpleural nodule with irregular margins in the right upper lobe.  Is unsure if this represents focal inflammation and possible neoplasm.  Recommending follow-up chest CT in 3 to 4 weeks to ensure stability or resolution.  Patient reporting today that he is on ongoing shortness of breath with exertion and  right-sided chest pain with movement for the last 2 weeks. He has an ongoing cough which is clear.  Reviewed office notes from primary care in [facility] Dr. Link Snuffer. Patient was seen on 03/16/2020. This was a work in visit. Reporting pain when breathing as well as increased lung pain ever since traveling back from [place]. D-dimer was elevated at that time at 0.66. Given patient's age this is actually considered  a normal result.  Patient reports that he has sternal chest pain whenever he takes a deep breath in.  He reports this initially happened when he was in [place] then it went away.  Then a few days later he was raking leaves and the symptoms came back.  This is what prompted the visit with primary care.  He still occasionally has this pain especially when taking a full deep breath.  No significant family history of interstitial lung disease or connective tissue disorders.  Father did have tuberculosis is a young adult and did require pneumonectomy.  Mother did die of lung cancer and she was a never smoker.  Patient presents today with his spouse with multiple questions regarding follow-up and work-up.  We will discuss this today.  Patient reports he never developed a fever or had any sort of productive cough.  He was never treated with antibiotics.  Patient was walked in office today was able to complete 3 laps of the desaturations or tachycardia.  Questionaires / Pulmonary Flowsheets:   ACT:  No flowsheet data found.  MMRC: No flowsheet data found.  Epworth:  No flowsheet data found.  Tests:   05/06/2019-CT chest high-res-no evidence of interstitial lung disease, no evidence of asbestos related pleural disease, scattered small solid pulmonary nodules largest being 4 mm, no follow-up needed if patient is low risk, one-vessel coronary arthrosclerosis  08/17/2015-CT maxillofacial-paranasal sinuses clear aside from minimal mucosal edema at the ostium  of the maxillary sinus bilaterally   Dec 2021   04/12/2020 Follow up : Lung nodules  Patient returns for a 1 month follow-up.  Patient was seen last visit for an abnormal CT chest with lung nodule.  Patient was seen initially for a pulmonary consult November 2020 for shortness of breath with activity.  Work-up showed a high-resolution CT chest that was negative for interstitial lung disease.  It showed scattered small solid pulmonary nodules largest 4 mm.  Pulmonary function testing was normal.  And walk test in the office showed no hypoxia with ambulation.    Patient had recent trip to [facility] [place] , had sudden onset of dyspnea and chest tightness. Seen by PCP, D Dimer was positive . A CT chest was done March 17, 2020 that was negative for PE.  Mild biapical scarring.  A new 12 x 9 mm subpleural nodule noted with irregular margins in the right upper lobe.  Stable irregular density with calcifications in the right posterior lung consistent with scarring or calcified granuloma. Says that his chest tightness resolved.   A repeat CT chest was done April 09, 2020 that showed no significant interval change in right upper lobe nodule.  Measuring 13 x 7 mm.  Associated pleural thickening and pleural parenchymal scarring.  2 new nodules in the right upper lobe measuring up to 8 mm with irregular margins.  Left lung was clear.  CT impression findings were favored to represent a possible infectious or inflammatory process given the rapid interval development. We discussed his CT results. Minimal dry cough . No Covid infection history . Declines vaccine currently.  15-20 years dry cough and some dyspnea with heavy exercise.  Patient is active does some exercising and walking.  Occasional hiking and biking.  Is able to complete activity but feels more short of breath with heavy activity. No increased cough,congestion,  Has noted rash spots on leg.  Went to the dermatologist and prescribed a cream but rash did  not resolve.  It is nonpruritic.  Mild hyper pigmentation. FH + mother died lung cancer - never smoker .  Patient denies any unintentional weight loss.  No hemoptysis.   04/12/2020 Follow up : Lung nodules  Patient returns for a 1 month follow-up.  Patient was seen last visit for an abnormal CT chest with lung nodule.  Patient was seen initially for a pulmonary consult November 2020 for shortness of breath with activity.  Work-up showed a high-resolution CT chest that was negative for interstitial lung disease.  It showed scattered small solid pulmonary nodules largest 4 mm.  Pulmonary function testing was normal.  And walk test in the office showed no hypoxia with ambulation.    Patient had recent trip to [facility] [place] , had sudden onset of dyspnea and chest tightness. Seen by PCP, D Dimer was positive . A CT chest was done March 17, 2020 that was negative for PE.  Mild biapical scarring.  A new 12 x 9 mm subpleural nodule noted with irregular margins in the right upper lobe.  Stable irregular density with calcifications in the right posterior lung consistent with scarring or calcified granuloma. Says that his chest tightness resolved.   A repeat CT chest was done April 09, 2020 that showed no significant interval change in right upper lobe nodule.  Measuring 13 x 7 mm.  Associated pleural thickening and pleural parenchymal scarring.  2 new nodules in the right upper lobe measuring up to 8 mm with irregular margins.  Left lung was clear.  CT impression findings were favored to represent a possible infectious or inflammatory process given the rapid interval development. We discussed his CT results. Minimal dry cough . No Covid infection history . Declines vaccine currently.  15-20 years dry cough and some dyspnea with heavy exercise.  Patient is active does some exercising and walking.  Occasional hiking and biking.  Is able to complete activity but feels more short of breath with heavy activity. No  increased cough,congestion,  Has noted rash spots on leg.  Went to the dermatologist and prescribed a cream but rash did not resolve.  It is nonpruritic.  Mild hyper pigmentation. FH + mother died lung cancer - never smoker .  Patient denies any unintentional weight loss.  No hemoptysis.    OV 06/03/2020  Subjective:  Patient ID: Ellene Route, male , DOB: 11-Aug-1950 , age 52 y.o. , MRN: [id number] , ADDRESS: [address] [place] [address] PCP Alysia Penna, MD Patient Care Team: Alysia Penna, MD as PCP - General (Internal Medicine) Janalyn Harder, MD as Consulting Physician (Dermatology)  This Provider for this visit: Treatment Team:  Attending Provider: Kalman Shan, MD    06/03/2020 -   Chief Complaint  Patient presents with   Follow-up    Pt states he has been doing good since last visit. Pt had a recent CT performed and is here to discuss results.    Follow-up multiple lung nodules HPI GIORGI WINSTED 72 y.o. -returns for follow-up.  I used to see him for shortness of breath and then he is seen nurse practitioner.  In between he developed nodules.  Most recently saw Tammy ferritin December 2021 and he had nodules.  Says had a follow-up CT chest.  All nodules are gone.  Insidious new nodules that look quite inflammatory.  Some of them were greater than 1 cm.  I personally visualized this with him.  He is here with his wife.  Is quite concerned about these nodules.  We went over  different histories.  There is nothing to suggest connective tissue disease.  There is no postnasal drip of poor dentition history there is no acid reflux history.  On the other hand he tells me that he uses firewood for heating in the winter because he enjoys it.  He also has an attic with her my straps.  He goes to the attic daily.  He does not wear a mask.  The attic is dusty.  Sometimes there is mold in it.  He also uses CPAP machine has not been clean for a long time.  Other than that there are  no other exposures.  No tuberculosis history.   CT Chest data  CT Chest Wo Contrast  Result Date: 06/02/2020 CLINICAL DATA:  Pulmonary nodules. EXAM: CT CHEST WITHOUT CONTRAST TECHNIQUE: Multidetector CT imaging of the chest was performed following the standard protocol without IV contrast. COMPARISON:  04/09/2020 FINDINGS: Cardiovascular: The heart size is normal. No substantial pericardial effusion. Coronary artery calcification is evident. Atherosclerotic calcification is noted in the wall of the thoracic aorta. Mediastinum/Nodes: No mediastinal lymphadenopathy. Calcified nodal tissue is seen in the subcarinal station and right hilum consistent with granulomatous disease. There is no axillary lymphadenopathy. The esophagus has normal imaging features. Lungs/Pleura: Biapical pleuroparenchymal scarring again noted. 8 mm right upper lobe pulmonary nodule seen on the prior study has almost completely resolved with only some trace residual visible on image 78/5 today. The second adjacent 8 mm nodule identified as new on the prior study has resolved completely in the interval. 7 mm nodule in the lingula (114/5) is new since prior. 15 mm left lower lobe nodule on 143/5 is new in the interval. New 11 mm irregular pulmonary nodule identified right upper lobe on 63/5. 7 mm right upper lobe nodule on 54/5 is new since prior. Calcified granulomata again noted medial right costophrenic sulcus.No focal airspace consolidation. No pleural effusion. Upper Abdomen: Unremarkable. Musculoskeletal: No worrisome lytic or sclerotic osseous abnormality. IMPRESSION: 1. The 2 right upper lobe pulmonary nodules seen as new on the prior study have almost completely resolved completely in the interval. 2. Multiple new bilateral pulmonary nodules measuring up to 15 mm. Rapid interval appearance suggests infectious/inflammatory etiology. Non-contrast chest CT at 3-6 months is recommended. If the nodules are stable at time of repeat CT,  then future CT at 18-24 months (from today's scan) is considered optional for low-risk patients, but is recommended for high-risk patients. This recommendation follows the consensus statement: Guidelines for Management of Incidental Pulmonary Nodules Detected on CT Images: From the Fleischner Society 2017; Radiology 2017; 284:228-243. 3. Calcified granulomatous disease. 4. Aortic Atherosclerosis (ICD10-I70.0). Electronically Signed   By: Kennith Center M.D.   On: 06/02/2020 09:24   OV 10/04/2020  Subjective:  Patient ID: Ellene Route, male , DOB: 11/09/1950 , age 73 y.o. , MRN: [id number] , ADDRESS: [address] [place] [address] PCP Alysia Penna, MD Patient Care Team: Alysia Penna, MD as PCP - General (Internal Medicine) Janalyn Harder, MD as Consulting Physician (Dermatology)  This Provider for this visit: Treatment Team:  Attending Provider: Kalman Shan, MD    10/04/2020 -   Chief Complaint  Patient presents with   Follow-up    Pt states he has been doing well since last visit and denies any complaints. Pt had recent CT scan performed and is here today to go over the results.     HPI BRENIN MCCARLEY 72 y.o. -returns for follow-up of his pulmonary nodule.  He  presents with his wife.  I also take care of his mother-in-law who is in her 27s.  He is asymptomatic.  His CT scan of the chest showed- - Resolved pulmonary nodule - Coronary artery calcification.  Last stress test was many years ago.  Denies any chest pain - Calcified mediastinal adenopathy: He grew up [place] - Bilateral chronic apical lung scarring not consistent with ILD but radiology recommending 1 year follow-up.  He feels asymptomatic and is more open to clinical expectant follow-up.    CT Chest data 09/27/20  Narrative & Impression  CLINICAL DATA:  72 year old male with history of pulmonary nodule. Followup study.   EXAM: CT CHEST WITHOUT CONTRAST   TECHNIQUE: Multidetector CT imaging of the  chest was performed following the standard protocol without IV contrast.   COMPARISON:  Chest CT 06/01/2020.   FINDINGS: Cardiovascular: Heart size is normal. There is no significant pericardial fluid, thickening or pericardial calcification. There is aortic atherosclerosis, as well as atherosclerosis of the great vessels of the mediastinum and the coronary arteries, including calcified atherosclerotic plaque in the left anterior descending coronary artery.   Mediastinum/Nodes: No pathologically enlarged mediastinal or hilar lymph nodes. Multiple densely calcified bilateral hilar lymph nodes are incidentally noted. Please note that accurate exclusion of hilar adenopathy is limited on noncontrast CT scans. Esophagus is unremarkable in appearance. No axillary lymphadenopathy.   Lungs/Pleura: The new pulmonary nodule seen on the most recent prior examination have essentially resolved with some very subtle areas of very mild ground-glass attenuation in their wake, indicative of a benign infectious or inflammatory etiology on the prior study. No other new suspicious appearing pulmonary nodules or masses are noted. There continues to be extensive bilateral apical nodular pleuroparenchymal thickening and architectural distortion, similar to prior studies dating back to May 06, 2019, most compatible with chronic post infectious or inflammatory scarring, with the largest nodular area in the right upper lobe posteriorly near the apex (axial image 39 of series 5) measuring 9 x 4 mm, favored to be benign. No acute consolidative airspace disease. No pleural effusions. Calcified granuloma in the right lower lobe incidentally noted.   Upper Abdomen: Aortic atherosclerosis.   Musculoskeletal: There are no aggressive appearing lytic or blastic lesions noted in the visualized portions of the skeleton.   IMPRESSION: 1. The previously noted pulmonary nodules of concern have essentially  completely resolved, indicative of benign infectious or inflammatory nodules on the prior study. 2. There continues to be nodular areas of bilateral pleuroparenchymal thickening and architectural distortion in the apices of both lungs, strongly favored to be benign. Repeat noncontrast chest CT is recommended in 12 months to ensure continued stability. This recommendation follows the consensus statement: Guidelines for Management of Incidental Pulmonary Nodules Detected on CT Images: From the Fleischner Society 2017; Radiology 2017; 284:228-243. 3. Aortic atherosclerosis, in addition to left anterior descending coronary artery disease. Please note that although the presence of coronary artery calcium documents the presence of coronary artery disease, the severity of this disease and any potential stenosis cannot be assessed on this non-gated CT examination. Assessment for potential risk factor modification, dietary therapy or pharmacologic therapy may be warranted, if clinically indicated.   Aortic Atherosclerosis (ICD10-I70.0).     Electronically Signed   By: Trudie Reed M.D.   On: 09/28/2020 08:53     OV 10/09/2022  Subjective:  Patient ID: Ellene Route, male , DOB: Nov 21, 1950 , age 76 y.o. , MRN: [id number] , ADDRESS: [address] [place]  [place] [address] PCP Alysia Penna, MD Patient Care Team: Alysia Penna, MD as PCP - General (Internal Medicine) Janalyn Harder, MD (Inactive) as Consulting Physician (Dermatology)  This Provider for this visit: Treatment Team:  Attending Provider: Kalman Shan, MD    10/09/2022 -  No chief complaint on file.    HPI WAREN BERGE 72 y.o. -    CT Chest data6/5/24  - personally visualized and independently interpreted and my findings are: *** Narrative & Impression  CLINICAL DATA:  72 year old male with history of dyspnea on exertion.   EXAM: CT CHEST WITHOUT CONTRAST   TECHNIQUE: Multidetector CT imaging of the  chest was performed following the standard protocol without intravenous contrast. High resolution imaging of the lungs, as well as inspiratory and expiratory imaging, was performed.   RADIATION DOSE REDUCTION: This exam was performed according to the departmental dose-optimization program which includes automated exposure control, adjustment of the mA and/or kV according to patient size and/or use of iterative reconstruction technique.   COMPARISON:  Chest CT 09/27/2020.   FINDINGS: Cardiovascular: Heart size is normal. There is no significant pericardial fluid, thickening or pericardial calcification. There is aortic atherosclerosis, as well as atherosclerosis of the great vessels of the mediastinum and the coronary arteries, including calcified atherosclerotic plaque in the left anterior descending coronary artery.   Mediastinum/Nodes: No pathologically enlarged mediastinal or hilar lymph nodes. Please note that accurate exclusion of hilar adenopathy is limited on noncontrast CT scans. Several densely calcified right hilar and subcarinal lymph nodes are incidentally noted. Esophagus is unremarkable in appearance. No axillary lymphadenopathy.   Lungs/Pleura: Bilateral apical nodular pleuroparenchymal thickening and architectural distortion, similar to numerous prior examinations, most compatible with areas of chronic post infectious or inflammatory scarring. The largest of these nodules in the right upper lobe (axial image 41 of series 9) measures 9 x 4 mm (unchanged dating back to January 2021). Calcified granulomas are also noted in the right lower lobe. No other new suspicious appearing pulmonary nodules or masses are noted. No acute consolidative airspace disease. No pleural effusions. High-resolution images demonstrate no significant regions of ground-glass attenuation, septal thickening, subpleural reticulation, parenchymal banding, traction bronchiectasis or frank  honeycombing to indicate interstitial lung disease. Inspiratory and expiratory imaging is unremarkable.   Upper Abdomen: Aortic atherosclerosis.   Musculoskeletal: There are no aggressive appearing lytic or blastic lesions noted in the visualized portions of the skeleton.   IMPRESSION: 1. No findings to suggest interstitial lung disease. 2. Stable chronic nodular pleuroparenchymal thickening in the lung apices similar to numerous prior examinations, considered definitively benign requiring no future imaging follow-up. 3. Old granulomatous disease, as above. 4. Aortic atherosclerosis, in addition to left anterior descending coronary artery disease. Please note that although the presence of coronary artery calcium documents the presence of coronary artery disease, the severity of this disease and any potential stenosis cannot be assessed on this non-gated CT examination. Assessment for potential risk factor modification, dietary therapy or pharmacologic therapy may be warranted, if clinically indicated.   Aortic Atherosclerosis (ICD10-I70.0).     Electronically Signed   By: Trudie Reed M.D.   On: 10/04/2022 08:24       PFT     Latest Ref Rng & Units 07/08/2019   10:50 AM  PFT Results  FVC-Pre L 5.16   FVC-Predicted Pre % 95   FVC-Post L 5.22   FVC-Predicted Post % 96   Pre FEV1/FVC % % 77   Post FEV1/FCV % % 78   FEV1-Pre  L 3.99   FEV1-Predicted Pre % 99   FEV1-Post L 4.06   DLCO uncorrected ml/min/mmHg 28.72   DLCO UNC% % 93   DLCO corrected ml/min/mmHg 28.72   DLCO COR %Predicted % 93   DLVA Predicted % 106   TLC L 7.53   TLC % Predicted % 93   RV % Predicted % 100        has a past medical history of Benign prostatic hypertrophy, Diverticulosis, Esophageal abnormality, Hearing loss in left ear, Heart murmur, History of mitral valve prolapse, Hyperlipidemia, Hypertension, Hypothyroidism, OSA (obstructive sleep apnea), PONV (postoperative nausea and  vomiting), Sleep apnea, and Tubular adenoma of colon (01/2008).   reports that he has never smoked. He has never used smokeless tobacco.  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   TONSILLECTOMY     WISDOM TOOTH EXTRACTION      Allergies  Allergen Reactions   Other     Anesthetic    Immunization History  Administered Date(s) Administered   Hepatitis A 01/03/2005, 07/21/2005   Hepatitis B 01/03/2005, 02/07/2005, 07/03/2005   Influenza Split 04/24/2012, 02/03/2014, 02/20/2020   Influenza, High Dose Seasonal PF 03/16/2019   Influenza, Quadrivalent, Recombinant, Inj, Pf 02/12/2020   Pneumococcal Conjugate-13 03/27/2018   Pneumococcal Polysaccharide-23 04/15/2019, 05/24/2020   Td 10/31/2004   Zoster Recombinat (Shingrix) 01/22/2017   Zoster, Live 04/24/2010, 12/22/2010, 03/30/2017    Family History  Problem Relation Age of Onset   Tuberculosis Father        in college   Hypertension Mother    Coronary artery disease Maternal Uncle        died in sleep, > 55   Cancer Paternal Grandmother         ? primary   Colon cancer Maternal Uncle    Stroke Neg Hx    Colon polyps Neg Hx    Rectal cancer Neg Hx    Stomach cancer Neg Hx    Esophageal cancer Neg Hx      Current Outpatient Medications:    aspirin 81 MG tablet, Take 81 mg by mouth daily., Disp: , Rfl:    Cholecalciferol (VITAMIN D3) 2000 UNITS TABS, Take 2 tablets by mouth daily., Disp: , Rfl:    Coenzyme Q10 (COQ10) 100 MG CAPS, Take 1 capsule by mouth daily., Disp: , Rfl:    doxazosin (CARDURA) 8 MG tablet, TAKE ONE-FOURTH TABLET BY MOUTH EVERY  DAY, Disp: 90 tablet, Rfl: 0   hydrocortisone 2.5 % ointment, APPLY TO AFFECTED AREA AT BEDTIME AS DIRECTED, Disp: 20 g, Rfl: 11   levothyroxine (SYNTHROID, LEVOTHROID) 25 MCG tablet, TAKE ONE AND ONE-HALF TABLETS BY MOUTH ONCE DAILY,EXCEPT TAKE 1 TABLET BY MOUTH ON WEDNESDAY (Patient taking differently: 1 /12 tablets daily), Disp: 120 tablet, Rfl: 0   Multiple Vitamins-Minerals (MENS 50+ MULTI VITAMIN/MIN) TABS, Take 1 tablet by mouth daily., Disp: , Rfl:    neomycin-polymyxin-hydrocortisone (CORTISPORIN) 3.5-10000-1 OTIC suspension, neomycin-polymyxin-hydrocort 3.5 mg-10,000 unit/mL-1 % ear drops,susp (Patient  not taking: Reported on 04/28/2021), Disp: , Rfl:    nystatin-triamcinolone (MYCOLOG II) cream, nystatin-triamcinolone 100,000 unit/g-0.1 % topical cream (Patient not taking: Reported on 04/28/2021), Disp: , Rfl:    Omega-3 Fatty Acids (FISH OIL) 1000 MG CAPS, Take 1 capsule by mouth daily., Disp: , Rfl:    pravastatin (PRAVACHOL) 80 MG tablet, Take 1 tablet (80 mg total) by mouth daily., Disp: 90 tablet, Rfl: 3   PRESCRIPTION MEDICATION, Medication Name: CASH Otic Powder Mix the following powders together at these concentrations: 1. Ciprofloxacin                  30mg  2. Amphotericin B               5mg  3. Sulfacetamide Sodium  50mg  4. Hydrocortisone              25mg  Place mixed powders into #3 gelatin capsules. Keep capsules refrigerated. Dispense: #90 capsules Sig: Separate capsule. Place longer end of capsule into stem of OtoMed Powder Insufflator.        Administer 2 puffs of powder, in ear(s) with mastoid cavity twice weekly (Wed & Sat)., Disp: , Rfl:    Psyllium (QC NATURAL VEGETABLE) 95 % POWD, Take by mouth., Disp: , Rfl:    triamcinolone cream (KENALOG) 0.1 %, Apply 1 application topically daily., Disp: 80 g, Rfl: 11   Zinc Sulfate (ZINC 15 PO), Take 1 tablet by mouth once as needed., Disp: , Rfl:       Objective:   There were no vitals filed for this visit.  Estimated body mass  index is 23.81 kg/m as calculated from the following:   Height as of 05/12/21: 6' 2.5" (1.892 m).   Weight as of 05/12/21: 188 lb (85.3 kg).  @WEIGHTCHANGE @  There were no vitals filed for this visit.   Physical Exam   General: No distress. *** O2 at rest: *** Cane present: *** Sitting in wheel chair: *** Frail: *** Obese: *** Neuro: Alert and Oriented x 3. GCS 15. Speech normal Psych: Pleasant Resp:  Barrel Chest - ***.  Wheeze - ***, Crackles - ***, No overt respiratory distress CVS: Normal heart sounds. Murmurs - *** Ext: Stigmata of Connective Tissue Disease - *** HEENT: Normal upper airway. PEERL +. No post nasal drip        Assessment:     No diagnosis found.     Plan:     There are no Patient Instructions on file for this visit.    SIGNATURE    Dr. Kalman Shan, M.D., F.C.C.P,  Pulmonary and Critical Care Medicine Staff Physician, [facility] Center Director - Interstitial Lung Disease  Program  Pulmonary Fibrosis [facility] at [facility] [place], [place], [address]  Pager: [phone], If no answer or between  15:00h - 7:00h: call [phone] Telephone: [phone]  7:23 AM 10/09/2022   Moderate Complexity MDM OFFICE  2021 E/M guidelines, first released in 2021, with minor revisions added in 2023 and 2024 Must meet the requirements for 2 out of 3 dimensions to qualify.    Number and complexity of problems addressed Amount and/or complexity of data reviewed Risk of complications and/or morbidity  One or more chronic illness with mild exacerbation, OR progression, OR  side effects of treatment  Two or more stable chronic illnesses  One undiagnosed new problem with uncertain prognosis  One acute illness with systemic symptoms   One Acute complicated injury  Must meet the requirements for 1 of 3 of the categories)  Category 1: Tests and documents, historian  Any combination of 3 of the  following:  Assessment requiring an independent historian  Review of prior external note(s) from each unique source  Review of results of each unique test  Ordering of each unique test    Category 2: Interpretation of tests   Independent interpretation of a test performed by another physician/other qualified health care professional (not separately reported)  Category 3: Discuss management/tests  Discussion of management or test interpretation with external physician/other qualified health care professional/appropriate source (not separately reported) Moderate risk of morbidity from additional diagnostic testing or treatment Examples only:  Prescription drug management  Decision regarding minor surgery with identfied patient or procedure risk factors  Decision regarding elective major surgery without identified patient or procedure risk factors  Diagnosis or treatment significantly limited by social determinants of health             HIGh Complexity  OFFICE   2021 E/M guidelines, first released in 2021, with minor revisions added in 2023. Must meet the requirements for 2 out of 3 dimensions to qualify.    Number and complexity of problems addressed Amount and/or complexity of data reviewed Risk of complications and/or morbidity  Severe exacerbation of chronic illness  Acute or chronic illnesses that may pose a threat to life or bodily function, e.g., multiple trauma, acute MI, pulmonary embolus, severe respiratory distress, progressive rheumatoid arthritis, psychiatric illness with potential threat to self or others, peritonitis, acute renal failure, abrupt change in neurological status Must meet the requirements for 2 of 3 of the categories)  Category 1: Tests and documents, historian  Any combination of 3 of the following:  Assessment requiring an independent historian  Review of prior external note(s) from each unique source  Review of results of each unique  test  Ordering of each unique test    Category 2: Interpretation of tests    Independent interpretation of a test performed by another physician/other qualified health care professional (not separately reported)  Category 3: Discuss management/tests  Discussion of management or test interpretation with external physician/other qualified health care professional/appropriate source (not separately reported)  HIGH risk of morbidity from additional diagnostic testing or treatment Examples only:  Drug therapy requiring intensive monitoring for toxicity  Decision for elective major surgery with identified pateint or procedure risk factors  Decision regarding hospitalization or escalation of level of care  Decision for DNR or to de-escalate care   Parenteral controlled  substances            LEGEND - Independent interpretation involves the interpretation of a test for which there is a CPT code, and an interpretation or report is customary. When a review and interpretation of a test is performed and documented by the provider, but not separately reported (billed), then this would represent an independent interpretation. This report does not need to conform to the usual standards of a complete report of the test. This does not include interpretation of tests that do not have formal reports such as a complete blood count with differential and blood cultures. Examples would include reviewing a chest radiograph and documenting in the medical record an interpretation, but not separately reporting (billing) the interpretation of the chest radiograph.   An appropriate source includes professionals who are not health care professionals but may be involved in the management of the patient, such as a Clinical research associate, upper officer, case manager or  teacher, and does not include discussion with family or informal caregivers.    - SDOH: SDOH are the conditions in the environments where people are  born, live, learn, work, play, worship, and age that affect a wide range of health, functioning, and quality-of-life outcomes and risks. (e.g., housing, food insecurity, transportation, etc.). SDOH-related Z codes ranging from Z55-Z65 are the ICD-10-CM diagnosis codes used to document SDOH data Z55 - Problems related to education and literacy Z56 - Problems related to employment and unemployment Z57 - Occupational exposure to risk factors Z58 - Problems related to physical environment Z59 - Problems related to housing and economic circumstances [phone] - Problems related to social environment [phone] - Problems related to upbringing [phone] - Other problems related to primary support group, including family circumstances Z104 - Problems related to certain psychosocial circumstances Z65 - Problems related to other psychosocial circumstances

## 2022-10-09 NOTE — Telephone Encounter (Signed)
Went over [employer] with patients wife. She states patient is doing well and will fu with Dr. Marchelle Gearing as needed. Closing encounter. NFN

## 2022-10-12 DIAGNOSIS — Z125 Encounter for screening for malignant neoplasm of prostate: Secondary | ICD-10-CM | POA: Diagnosis not present

## 2022-11-06 DIAGNOSIS — M25561 Pain in right knee: Secondary | ICD-10-CM | POA: Diagnosis not present

## 2023-02-05 DIAGNOSIS — Z23 Encounter for immunization: Secondary | ICD-10-CM | POA: Diagnosis not present

## 2023-03-02 DIAGNOSIS — H7492 Unspecified disorder of left middle ear and mastoid: Secondary | ICD-10-CM | POA: Diagnosis not present

## 2023-03-02 DIAGNOSIS — H7291 Unspecified perforation of tympanic membrane, right ear: Secondary | ICD-10-CM | POA: Diagnosis not present

## 2023-03-02 DIAGNOSIS — Z9621 Cochlear implant status: Secondary | ICD-10-CM | POA: Diagnosis not present

## 2023-03-02 DIAGNOSIS — Z9889 Other specified postprocedural states: Secondary | ICD-10-CM | POA: Diagnosis not present

## 2023-03-02 DIAGNOSIS — H906 Mixed conductive and sensorineural hearing loss, bilateral: Secondary | ICD-10-CM | POA: Diagnosis not present

## 2023-03-26 DIAGNOSIS — H18593 Other hereditary corneal dystrophies, bilateral: Secondary | ICD-10-CM | POA: Diagnosis not present

## 2023-06-05 DIAGNOSIS — E039 Hypothyroidism, unspecified: Secondary | ICD-10-CM | POA: Diagnosis not present

## 2023-06-05 DIAGNOSIS — N401 Enlarged prostate with lower urinary tract symptoms: Secondary | ICD-10-CM | POA: Diagnosis not present

## 2023-06-05 DIAGNOSIS — E785 Hyperlipidemia, unspecified: Secondary | ICD-10-CM | POA: Diagnosis not present

## 2023-06-12 DIAGNOSIS — N401 Enlarged prostate with lower urinary tract symptoms: Secondary | ICD-10-CM | POA: Diagnosis not present

## 2023-06-12 DIAGNOSIS — G4733 Obstructive sleep apnea (adult) (pediatric): Secondary | ICD-10-CM | POA: Diagnosis not present

## 2023-06-12 DIAGNOSIS — L570 Actinic keratosis: Secondary | ICD-10-CM | POA: Diagnosis not present

## 2023-06-12 DIAGNOSIS — K219 Gastro-esophageal reflux disease without esophagitis: Secondary | ICD-10-CM | POA: Diagnosis not present

## 2023-06-12 DIAGNOSIS — I700 Atherosclerosis of aorta: Secondary | ICD-10-CM | POA: Diagnosis not present

## 2023-06-12 DIAGNOSIS — Z0000 Encounter for general adult medical examination without abnormal findings: Secondary | ICD-10-CM | POA: Diagnosis not present

## 2023-06-12 DIAGNOSIS — I2584 Coronary atherosclerosis due to calcified coronary lesion: Secondary | ICD-10-CM | POA: Diagnosis not present

## 2023-06-12 DIAGNOSIS — R590 Localized enlarged lymph nodes: Secondary | ICD-10-CM | POA: Diagnosis not present

## 2023-06-12 DIAGNOSIS — E785 Hyperlipidemia, unspecified: Secondary | ICD-10-CM | POA: Diagnosis not present

## 2023-06-12 DIAGNOSIS — Z23 Encounter for immunization: Secondary | ICD-10-CM | POA: Diagnosis not present

## 2023-06-12 DIAGNOSIS — Z1331 Encounter for screening for depression: Secondary | ICD-10-CM | POA: Diagnosis not present

## 2023-06-12 DIAGNOSIS — Z1339 Encounter for screening examination for other mental health and behavioral disorders: Secondary | ICD-10-CM | POA: Diagnosis not present

## 2023-06-12 DIAGNOSIS — K429 Umbilical hernia without obstruction or gangrene: Secondary | ICD-10-CM | POA: Diagnosis not present

## 2023-06-12 DIAGNOSIS — E039 Hypothyroidism, unspecified: Secondary | ICD-10-CM | POA: Diagnosis not present

## 2023-06-12 DIAGNOSIS — H539 Unspecified visual disturbance: Secondary | ICD-10-CM | POA: Diagnosis not present

## 2023-06-12 DIAGNOSIS — R911 Solitary pulmonary nodule: Secondary | ICD-10-CM | POA: Diagnosis not present

## 2023-06-13 ENCOUNTER — Encounter: Payer: Self-pay | Admitting: Internal Medicine

## 2023-09-18 DIAGNOSIS — L814 Other melanin hyperpigmentation: Secondary | ICD-10-CM | POA: Diagnosis not present

## 2023-09-18 DIAGNOSIS — L821 Other seborrheic keratosis: Secondary | ICD-10-CM | POA: Diagnosis not present

## 2023-09-18 DIAGNOSIS — L820 Inflamed seborrheic keratosis: Secondary | ICD-10-CM | POA: Diagnosis not present

## 2023-09-18 DIAGNOSIS — L570 Actinic keratosis: Secondary | ICD-10-CM | POA: Diagnosis not present

## 2023-10-05 ENCOUNTER — Ambulatory Visit (INDEPENDENT_AMBULATORY_CARE_PROVIDER_SITE_OTHER): Payer: Self-pay | Admitting: Internal Medicine

## 2023-10-05 ENCOUNTER — Other Ambulatory Visit: Payer: Self-pay | Admitting: Internal Medicine

## 2023-10-05 ENCOUNTER — Encounter: Payer: Self-pay | Admitting: Internal Medicine

## 2023-10-05 VITALS — BP 122/76 | HR 60 | Ht 74.5 in | Wt 172.3 lb

## 2023-10-05 DIAGNOSIS — R0602 Shortness of breath: Secondary | ICD-10-CM

## 2023-10-05 DIAGNOSIS — R053 Chronic cough: Secondary | ICD-10-CM | POA: Diagnosis not present

## 2023-10-05 LAB — CBC WITH DIFFERENTIAL/PLATELET
Absolute Lymphocytes: 931 {cells}/uL (ref 850–3900)
Absolute Monocytes: 504 {cells}/uL (ref 200–950)
Basophils Absolute: 39 {cells}/uL (ref 0–200)
Basophils Relative: 1.1 %
Eosinophils Absolute: 147 {cells}/uL (ref 15–500)
Eosinophils Relative: 4.2 %
HCT: 39.5 % (ref 38.5–50.0)
Hemoglobin: 12.8 g/dL — ABNORMAL LOW (ref 13.2–17.1)
MCH: 32.4 pg (ref 27.0–33.0)
MCHC: 32.4 g/dL (ref 32.0–36.0)
MCV: 100 fL (ref 80.0–100.0)
MPV: 10.6 fL (ref 7.5–12.5)
Monocytes Relative: 14.4 %
Neutro Abs: 1880 {cells}/uL (ref 1500–7800)
Neutrophils Relative %: 53.7 %
Platelets: 148 10*3/uL (ref 140–400)
RBC: 3.95 10*6/uL — ABNORMAL LOW (ref 4.20–5.80)
RDW: 12 % (ref 11.0–15.0)
Total Lymphocyte: 26.6 %
WBC: 3.5 10*3/uL — ABNORMAL LOW (ref 3.8–10.8)

## 2023-10-05 LAB — POCT EXHALED NITRIC OXIDE: FeNO level (ppb): 21

## 2023-10-05 MED ORDER — TRELEGY ELLIPTA 100-62.5-25 MCG/ACT IN AEPB: 1.0000 | INHALATION_SPRAY | Freq: Once | RESPIRATORY_TRACT | Status: AC

## 2023-10-05 NOTE — Progress Notes (Addendum)
 IOV 03/17/2019  Chief Complaint  Patient presents with   Consult    Referred by Dr. Jolee Naval due to asbestos exposure. Pt states he has an occ cough with occ clear phlegm that pt says he has had for years.       Mike Stephens Integrated Comprehensive Questionnaire  Symptoms: 73 year old male reports a chronic history of shortness of breath and cough of mild to moderate intensity that is stable since its onset.  On the questionnaire he reports onset of dyspnea 1972.  He says several years ago he had a chest x-ray done by Dr. Donnice Gale  who was then his primary care physician and there was some changes in the lung that were consistent with him living in the [place].  He says he grew up in [place]. Mike Stephens .  He was also told at that time that the changes were consistent with him having birds in the house.  He said he had pet parakeets in the house.  He says he has mild to moderate dyspnea while climbing up mountains or doing hikes.  It is not necessarily worse.  He says he wants to get to the bottom of all this.  Shortness of breath made worse by exertion and relieved by rest.  Things like bending over and any mask over the face does make him more winded.  He does endorse a mild cough for the last 20 or 30 years since it started it is the same.  He does clear his throat a lot.  He just brings up some phlegm.  It is clear.  There is no hemoptysis or cough when he lies down or cough affecting his voice.  There is no tickle in the back of the throat.      Past Medical History :   -Denies any asthma or COPD or heart failure rheumatoid arthritis or scleroderma or lupus or polymyositis or Sjogren's.  Denies any acid reflux or hiatal hernia.  Denies any immune system disorder.  Denies pulmonary hypertension.  Denies stroke denies seizures.  Denies hepatitis.  Denies tuberculosis.  Denies blood clots denies heart disease.  Several decades ago he had pleurisy.  He had pneumonia a few decades ago.  He  has had thyroid  issues for several years and sleep apnea for not otherwise specified.   ROS: Positive for fatigue and arthralgia in the hands and the back.  For several decades he does notice pain no color change white-red in the fingers with cold weather.  And he does snore for the few decades.  But denies any ulcers or rash or nausea vomiting or recurrent fever or dysphagia   FAMILY HISTORY of LUNG DISEASE: * -Denies pulmonary fibrosis or COPD or asthma sarcoidosis or cystic fibrosis or hypersensitivity pneumonitis or autoimmune disease.  His mother-in-law is my patient.   EXPOSURE HISTORY:  -Denies smoking any cigarettes or cigars or pipe.  No marijuana use.  No vaping no cocaine no intravenous drug use.   HOME and HOBBY DETAILS :  -[place] the last 32 years.  Age of the home is 46 years.  There is dampness in the laundry room with a leak.  There might be some mold or mildew in the shower curtains.  There is recent mold or mildew in the shower.  No humidifier use.  Does use CPAP but there is no mold in it.  Does not have a nebulizer machine.  No Jacuzzi use.  No misting  Fountain inside the house.  He did have a pet bird between the age of 13-18 years.  He does use a feather pillow.  He does not know of any mold in the [facility] duct.  He does do gardening with compost, woodchips mulch and flowers.  Overall a lot of hypersensitive pneumonitis type of exposures.   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  He did have a bird as a child.  He does use feather pillow.  Did have a parakeet as a child.  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  Some woodwork at [employer] were rewiring at home.   PULMONARY TOXICITY HISTORY (27 items):  -Denies.  There is no imaging to look at.     03/22/2020  - Visit   73 year old male never smoker followed in our office by Dr.  Bertrum Brodie for dyspnea on exertion.  Patient was last seen in our office in March/2021 by EW NP.  Plan of care from that office visit was as follows: Reviewed high-resolution CT chest that showed no evidence of ILD or spaces related pleural disease.  Some small scattered solid pulmonary nodule.  Pulmonary function testing was reviewed that showed no evidence of restriction or obstructive lung disease.  He was encouraged to remain on oral antihistamine as well as nasal steroid seasonally for his upper airway cough syndrome and continue follow-up with the ENT.  Patient was encouraged and scheduled to have this follow-up visit today at the request of primary care provider Dr. Jolee Naval.  Patient completed a CT angio of his chest on 03/17/2020.  She had a new 12 x 9 mm subpleural nodule with irregular margins in the right upper lobe.  Is unsure if this represents focal inflammation and possible neoplasm.  Recommending follow-up chest CT in 3 to 4 weeks to ensure stability or resolution.  Patient reporting today that he is on ongoing shortness of breath with exertion and right-sided chest pain with movement for the last 2 weeks. He has an ongoing cough which is clear.  Reviewed office notes from primary care in [facility] Dr. Jolee Naval. Patient was seen on 03/16/2020. This was a work in visit. Reporting pain when breathing as well as increased lung pain ever since traveling back from [facility] [place] . D-dimer was elevated at that time at 0.66. Given patient's age this is actually considered a normal result.  Patient reports that he has sternal chest pain whenever he takes a deep breath in.  He reports this initially happened when he was in [place]  then it went away.  Then a few days later he was raking leaves and the symptoms came back.  This is what prompted the visit with primary care.  He still occasionally has this pain especially when taking a full deep breath.  No significant family  history of interstitial lung disease or connective tissue disorders.  Father did have tuberculosis is a young adult and did require pneumonectomy.  Mother did die of lung cancer and she was a never smoker.  Patient presents today with his spouse with multiple questions regarding follow-up and work-up.  We will discuss this today.  Patient reports he never developed a fever or had any sort of productive cough.  He was never treated with antibiotics.  Patient was walked in office today was able to complete 3 laps of the desaturations or tachycardia.  Questionaires / Pulmonary Flowsheets:   ACT:  No  flowsheet data found.  MMRC: No flowsheet data found.  Epworth:  No flowsheet data found.  Tests:   05/06/2019-CT chest high-res-no evidence of interstitial lung disease, no evidence of asbestos related pleural disease, scattered small solid pulmonary nodules largest being 4 mm, no follow-up needed if patient is low risk, one-vessel coronary arthrosclerosis  08/17/2015-CT maxillofacial-paranasal sinuses clear aside from minimal mucosal edema at the ostium of the maxillary sinus bilaterally   Dec 2021   04/12/2020 Follow up : Lung nodules  Patient returns for a 1 month follow-up.  Patient was seen last visit for an abnormal CT chest with lung nodule.  Patient was seen initially for a pulmonary consult November 2020 for shortness of breath with activity.  Work-up showed a high-resolution CT chest that was negative for interstitial lung disease.  It showed scattered small solid pulmonary nodules largest 4 mm.  Pulmonary function testing was normal.  And walk test in the office showed no hypoxia with ambulation.    Patient had recent trip to [facility] [place] , had sudden onset of dyspnea and chest tightness. Seen by PCP, D Dimer was positive . A CT chest was done March 17, 2020 that was negative for PE.  Mild biapical scarring.  A new 12 x 9 mm subpleural nodule noted with irregular margins in the  right upper lobe.  Stable irregular density with calcifications in the right posterior lung consistent with scarring or calcified granuloma. Says that his chest tightness resolved.   A repeat CT chest was done April 09, 2020 that showed no significant interval change in right upper lobe nodule.  Measuring 13 x 7 mm.  Associated pleural thickening and pleural parenchymal scarring.  2 new nodules in the right upper lobe measuring up to 8 mm with irregular margins.  Left lung was clear.  CT impression findings were favored to represent a possible infectious or inflammatory process given the rapid interval development. We discussed his CT results. Minimal dry cough . No Covid infection history . Declines vaccine currently.  15-20 years dry cough and some dyspnea with heavy exercise.  Patient is active does some exercising and walking.  Occasional hiking and biking.  Is able to complete activity but feels more short of breath with heavy activity. No increased cough,congestion,  Has noted rash spots on leg.  Went to the dermatologist and prescribed a cream but rash did not resolve.  It is nonpruritic.  Mild hyper pigmentation. FH + mother died lung cancer - never smoker .  Patient denies any unintentional weight loss.  No hemoptysis.   04/12/2020 Follow up : Lung nodules  Patient returns for a 1 month follow-up.  Patient was seen last visit for an abnormal CT chest with lung nodule.  Patient was seen initially for a pulmonary consult November 2020 for shortness of breath with activity.  Work-up showed a high-resolution CT chest that was negative for interstitial lung disease.  It showed scattered small solid pulmonary nodules largest 4 mm.  Pulmonary function testing was normal.  And walk test in the office showed no hypoxia with ambulation.    Patient had recent trip to [facility] [place] , had sudden onset of dyspnea and chest tightness. Seen by PCP, D Dimer was positive . A CT chest was done March 17, 2020  that was negative for PE.  Mild biapical scarring.  A new 12 x 9 mm subpleural nodule noted with irregular margins in the right upper lobe.  Stable irregular density with calcifications in the  right posterior lung consistent with scarring or calcified granuloma. Says that his chest tightness resolved.   A repeat CT chest was done April 09, 2020 that showed no significant interval change in right upper lobe nodule.  Measuring 13 x 7 mm.  Associated pleural thickening and pleural parenchymal scarring.  2 new nodules in the right upper lobe measuring up to 8 mm with irregular margins.  Left lung was clear.  CT impression findings were favored to represent a possible infectious or inflammatory process given the rapid interval development. We discussed his CT results. Minimal dry cough . No Covid infection history . Declines vaccine currently.  15-20 years dry cough and some dyspnea with heavy exercise.  Patient is active does some exercising and walking.  Occasional hiking and biking.  Is able to complete activity but feels more short of breath with heavy activity. No increased cough,congestion,  Has noted rash spots on leg.  Went to the dermatologist and prescribed a cream but rash did not resolve.  It is nonpruritic.  Mild hyper pigmentation. FH + mother died lung cancer - never smoker .  Patient denies any unintentional weight loss.  No hemoptysis.    OV 06/03/2020  Subjective:  Patient ID: Mike Stephens, male , DOB: 1951/04/01 , age 16 y.o. , MRN: [id number] , ADDRESS: [address] [place] [address] PCP Barnetta Liberty, MD Patient Care Team: Barnetta Liberty, MD as PCP - General (Internal Medicine) Devon Fogo, MD as Consulting Physician (Dermatology)  This Provider for this visit: Treatment Team:  Attending Provider: Maire Scot, MD    06/03/2020 -   Chief Complaint  Patient presents with   Follow-up    Pt states he has been doing good since last visit. Pt had a recent CT  performed and is here to discuss results.    Follow-up multiple lung nodules HPI MARL SEAGO 73 y.o. -returns for follow-up.  I used to see him for shortness of breath and then he is seen nurse practitioner.  In between he developed nodules.  Most recently saw Tammy ferritin December 2021 and he had nodules.  Says had a follow-up CT chest.  All nodules are gone.  Insidious new nodules that look quite inflammatory.  Some of them were greater than 1 cm.  I personally visualized this with him.  He is here with his wife.  Is quite concerned about these nodules.  We went over different histories.  There is nothing to suggest connective tissue disease.  There is no postnasal drip of poor dentition history there is no acid reflux history.  On the other hand he tells me that he uses firewood for heating in the winter because he enjoys it.  He also has an attic with her my straps.  He goes to the attic daily.  He does not wear a mask.  The attic is dusty.  Sometimes there is mold in it.  He also uses CPAP machine has not been clean for a long time.  Other than that there are no other exposures.  No tuberculosis history.   CT Chest data  CT Chest Wo Contrast  Result Date: 06/02/2020 CLINICAL DATA:  Pulmonary nodules. EXAM: CT CHEST WITHOUT CONTRAST TECHNIQUE: Multidetector CT imaging of the chest was performed following the standard protocol without IV contrast. COMPARISON:  04/09/2020 FINDINGS: Cardiovascular: The heart size is normal. No substantial pericardial effusion. Coronary artery calcification is evident. Atherosclerotic calcification is noted in the wall of the thoracic aorta. Mediastinum/Nodes: No  mediastinal lymphadenopathy. Calcified nodal tissue is seen in the subcarinal station and right hilum consistent with granulomatous disease. There is no axillary lymphadenopathy. The esophagus has normal imaging features. Lungs/Pleura: Biapical pleuroparenchymal scarring again noted. 8 mm right upper lobe  pulmonary nodule seen on the prior study has almost completely resolved with only some trace residual visible on image 78/5 today. The second adjacent 8 mm nodule identified as new on the prior study has resolved completely in the interval. 7 mm nodule in the lingula (114/5) is new since prior. 15 mm left lower lobe nodule on 143/5 is new in the interval. New 11 mm irregular pulmonary nodule identified right upper lobe on 63/5. 7 mm right upper lobe nodule on 54/5 is new since prior. Calcified granulomata again noted medial right costophrenic sulcus.No focal airspace consolidation. No pleural effusion. Upper Abdomen: Unremarkable. Musculoskeletal: No worrisome lytic or sclerotic osseous abnormality. IMPRESSION: 1. The 2 right upper lobe pulmonary nodules seen as new on the prior study have almost completely resolved completely in the interval. 2. Multiple new bilateral pulmonary nodules measuring up to 15 mm. Rapid interval appearance suggests infectious/inflammatory etiology. Non-contrast chest CT at 3-6 months is recommended. If the nodules are stable at time of repeat CT, then future CT at 18-24 months (from today's scan) is considered optional for low-risk patients, but is recommended for high-risk patients. This recommendation follows the consensus statement: Guidelines for Management of Incidental Pulmonary Nodules Detected on CT Images: From the Fleischner Society 2017; Radiology 2017; 284:228-243. 3. Calcified granulomatous disease. 4. Aortic Atherosclerosis (ICD10-I70.0). Electronically Signed   By: Donnal Fusi M.D.   On: 06/02/2020 09:24   OV 10/04/2020  Subjective:  Patient ID: Mike Stephens, male , DOB: 04/16/1951 , age 62 y.o. , MRN: [id number] , ADDRESS: [address] [place] [address] PCP Barnetta Liberty, MD Patient Care Team: Barnetta Liberty, MD as PCP - General (Internal Medicine) Devon Fogo, MD as Consulting Physician (Dermatology)  This Provider for this visit: Treatment Team:   Attending Provider: Maire Scot, MD    10/04/2020 -   Chief Complaint  Patient presents with   Follow-up    Pt states he has been doing well since last visit and denies any complaints. Pt had recent CT scan performed and is here today to go over the results.     HPI FURKAN KEENUM 73 y.o. -returns for follow-up of his pulmonary nodule.  He presents with his wife.  I also take care of his mother-in-law who is in her 43s.  He is asymptomatic.  His CT scan of the chest showed- - Resolved pulmonary nodule - Coronary artery calcification.  Last stress test was many years ago.  Denies any chest pain - Calcified mediastinal adenopathy: He grew up in [place]. Mike - Bilateral chronic apical lung scarring not consistent with ILD but radiology recommending 1 year follow-up.  He feels asymptomatic and is more open to clinical expectant follow-up.    CT Chest data 09/27/20  Narrative & Impression  CLINICAL DATA:  73 year old male with history of pulmonary nodule. Followup study.   EXAM: CT CHEST WITHOUT CONTRAST   TECHNIQUE: Multidetector CT imaging of the chest was performed following the standard protocol without IV contrast.   COMPARISON:  Chest CT 06/01/2020.   FINDINGS: Cardiovascular: Heart size is normal. There is no significant pericardial fluid, thickening or pericardial calcification. There is aortic atherosclerosis, as well as atherosclerosis of the great vessels of the mediastinum and the coronary arteries, including calcified  atherosclerotic plaque in the left anterior descending coronary artery.   Mediastinum/Nodes: No pathologically enlarged mediastinal or hilar lymph nodes. Multiple densely calcified bilateral hilar lymph nodes are incidentally noted. Please note that accurate exclusion of hilar adenopathy is limited on noncontrast CT scans. Esophagus is unremarkable in appearance. No axillary lymphadenopathy.   Lungs/Pleura: The new pulmonary nodule seen on  the most recent prior examination have essentially resolved with some very subtle areas of very mild ground-glass attenuation in their wake, indicative of a benign infectious or inflammatory etiology on the prior study. No other new suspicious appearing pulmonary nodules or masses are noted. There continues to be extensive bilateral apical nodular pleuroparenchymal thickening and architectural distortion, similar to prior studies dating back to May 06, 2019, most compatible with chronic post infectious or inflammatory scarring, with the largest nodular area in the right upper lobe posteriorly near the apex (axial image 39 of series 5) measuring 9 x 4 mm, favored to be benign. No acute consolidative airspace disease. No pleural effusions. Calcified granuloma in the right lower lobe incidentally noted.   Upper Abdomen: Aortic atherosclerosis.   Musculoskeletal: There are no aggressive appearing lytic or blastic lesions noted in the visualized portions of the skeleton.   IMPRESSION: 1. The previously noted pulmonary nodules of concern have essentially completely resolved, indicative of benign infectious or inflammatory nodules on the prior study. 2. There continues to be nodular areas of bilateral pleuroparenchymal thickening and architectural distortion in the apices of both lungs, strongly favored to be benign. Repeat noncontrast chest CT is recommended in 12 months to ensure continued stability. This recommendation follows the consensus statement: Guidelines for Management of Incidental Pulmonary Nodules Detected on CT Images: From the Fleischner Society 2017; Radiology 2017; 284:228-243. 3. Aortic atherosclerosis, in addition to left anterior descending coronary artery disease. Please note that although the presence of coronary artery calcium  documents the presence of coronary artery disease, the severity of this disease and any potential stenosis cannot be assessed on this  non-gated CT examination. Assessment for potential risk factor modification, dietary therapy or pharmacologic therapy may be warranted, if clinically indicated.   Aortic Atherosclerosis (ICD10-I70.0).     Electronically Signed   By: Alexandria Angel M.D.   On: 09/28/2020 08:53    No results found.   OV 10/05/2023 -> it is exactly 3 years and 1 day since I last saw him therefore this is a new visit per Medicare criteria.  Subjective:  Patient ID: Mike Stephens, male , DOB: Jan 06, 1951 , age 65 y.o. , MRN: [id number] , ADDRESS: [address] [place] [address] PCP Barnetta Liberty, MD Patient Care Team: Barnetta Liberty, MD as PCP - General (Internal Medicine) Devon Fogo, MD (Inactive) as Consulting Physician (Dermatology)  This Provider for this visit: Treatment Team:  Attending Provider: Maire Scot, MD    10/05/2023 -   Chief Complaint  Patient presents with   Acute Visit    Coughing it a dry that has come back.he notice that itchy and cough after upstairs think he is having a reaction      HPI NORMAN PIACENTINI 73 y.o. -new consult because it is 3 years and 1 day since I last saw him.  I last saw him on October 04, 2020.  He is here because of new onset of chronic cough.  It was not there at the time of last visit.  It is mild and intermittent.  It happens only when he goes of his attic and he  comes down.  It is mild and transient and last 1 day or a few hours and then goes away.  When he wears a respirator the cough is less or not.  He says that there is no visible dust or mold but the place was built in the 1970s and he suspects this might be something there.  Otherwise he has no shortness of breath or wheezing.  He has no nocturnal awakenings.  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   SYMPTOM SCALE - ILD 03/17/2019  10/05/2023 fENO 21 pbb and normal  O2 use RA    Shortness of Breath 0 -> 5 scale with 5 being worst (score 6 If unable to do) 0  At rest 0 0  Simple tasks - showers, clothes change, eating, shaving 1 0  Household (dishes, doing bed, laundry) 1 0  Shopping 2 0  Walking level at own pace 2 0  Walking keeping up with others of same age 82 0  Walking up Stairs 4 0  Walking up Hill 5 0  Total (40 - 48) Dyspnea Score 17 0  How bad is your cough? mild 0 mild intermitten  How bad is your fatigue x 0       Simple office walk 185 feet x  3 laps goal with forehead probe 03/17/2019   O2 used ra  Number laps completed 3  Comments about pace normal  Resting Pulse Ox/HR 100% and 62/min  Final Pulse Ox/HR 100% and 80/min  Desaturated </= 88% no  Desaturated <= 3% points no  Got Tachycardic >/= 90/min no  Symptoms at end of test Very mild if any dyspnea  Miscellaneous comments Normal test          SIT STAND TEST - goal 15 times   10/05/2023    O2 used ra   PRobe - finter or forehead finger   Number sit and stand completed - goal 15 15   Time taken to complete 1   Resting Pulse Ox/HR/Dyspnea  98% and 60/min and dyspnea of 0/10    Peak measures 98 % and 85/min and dyspnea of 0/10   Final Pulse Ox/HR 100% and 65/min and dyspnea of 0/10   Desaturated </= 88% no   Desaturated <= 3% points no   Got Tachycardic >/= 90/min no   Miscellaneous comments no      IMPRESSION: - June June 202024. 1. No findings to suggest interstitial lung disease. 2. Stable chronic nodular pleuroparenchymal thickening in the lung apices similar to numerous prior examinations, considered definitively benign requiring no future imaging follow-up. 3. Old granulomatous disease, as above. 4. Aortic atherosclerosis, in addition to left anterior descending coronary artery disease. Please note that although the presence of coronary artery calcium  documents the presence of coronary artery disease, the severity of this disease and any potential  stenosis cannot be assessed on this non-gated CT examination. Assessment for potential risk factor modification, dietary therapy or pharmacologic therapy may be warranted, if clinically indicated.   Aortic Atherosclerosis (ICD10-I70.0).     Electronically Signed   By: Alexandria Angel M.D.   On: 10/04/2022 08:24   PFT     Latest Ref Rng & Units 07/08/2019   10:50 AM  PFT Results  FVC-Pre L 5.16   FVC-Predicted Pre % 95   FVC-Post L 5.22   FVC-Predicted Post % 96  Pre FEV1/FVC % % 77   Post FEV1/FCV % % 78   FEV1-Pre L 3.99   FEV1-Predicted Pre % 99   FEV1-Post L 4.06   DLCO uncorrected ml/min/mmHg 28.72   DLCO UNC% % 93   DLCO corrected ml/min/mmHg 28.72   DLCO COR %Predicted % 93   DLVA Predicted % 106   TLC L 7.53   TLC % Predicted % 93   RV % Predicted % 100        LAB RESULTS last 96 hours No results found.       has a past medical history of Benign prostatic hypertrophy, Diverticulosis, Esophageal abnormality, Hearing loss in left ear, Heart murmur, History of mitral valve prolapse, Hyperlipidemia, Hypertension, Hypothyroidism, OSA (obstructive sleep apnea), PONV (postoperative nausea and vomiting), Sleep apnea, and Tubular adenoma of colon (01/2008).   reports that he has never smoked. He has never used smokeless tobacco.  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   TONSILLECTOMY     WISDOM TOOTH EXTRACTION      Allergies  Allergen Reactions   Other     Anesthetic    Immunization History  Administered Date(s) Administered   Hepatitis A 01/03/2005, 07/21/2005   Hepatitis B  01/03/2005, 02/07/2005, 07/03/2005   Influenza Split 04/24/2012, 02/03/2014, 02/20/2020   Influenza, High Dose Seasonal PF 03/16/2019   Influenza, Quadrivalent, Recombinant, Inj, Pf 02/12/2020   Pneumococcal Conjugate-13 03/27/2018   Pneumococcal Polysaccharide-23 04/15/2019, 05/24/2020   Td 10/31/2004   Zoster Recombinant(Shingrix) 01/22/2017   Zoster, Live 04/24/2010, 12/22/2010, 03/30/2017    Family History  Problem Relation Age of Onset   Tuberculosis Father        in college   Hypertension Mother    Coronary artery disease Maternal Uncle        died in sleep, > 55   Cancer Paternal Grandmother         ? primary   Colon cancer Maternal Uncle    Stroke Neg Hx    Colon polyps Neg Hx    Rectal cancer Neg Hx    Stomach cancer Neg Hx    Esophageal cancer Neg Hx      Current Outpatient Medications:    aspirin 81 MG tablet, Take 81 mg by mouth daily., Disp: , Rfl:    Cholecalciferol (VITAMIN D3) 2000 UNITS TABS, Take 2 tablets by mouth daily., Disp: , Rfl:    Coenzyme Q10 (COQ10) 100 MG CAPS, Take 1 capsule by mouth daily., Disp: , Rfl:    doxazosin  (CARDURA ) 8 MG tablet, TAKE ONE-FOURTH TABLET BY MOUTH EVERY DAY, Disp: 90 tablet, Rfl: 0   hydrocortisone  2.5 % ointment, APPLY TO AFFECTED AREA AT BEDTIME AS DIRECTED, Disp: 20 g, Rfl: 11   levothyroxine  (SYNTHROID , LEVOTHROID) 25 MCG tablet, TAKE ONE AND ONE-HALF TABLETS BY MOUTH ONCE DAILY,EXCEPT TAKE 1 TABLET BY MOUTH ON WEDNESDAY (Patient taking differently: 1 /12 tablets daily), Disp: 120 tablet, Rfl: 0   Magnesium 250 MG TABS, 1  tablet with a meal Orally Once a day, Disp: , Rfl:    Multiple Vitamins-Minerals (MENS 50+ MULTI VITAMIN/MIN) TABS, Take 1 tablet by mouth daily., Disp: , Rfl:    neomycin-polymyxin-hydrocortisone  (CORTISPORIN) 3.5-10000-1 OTIC suspension, neomycin-polymyxin-hydrocort  3.5 mg-10,000 unit/mL-1 % ear drops,susp (Patient not taking: Reported on 04/28/2021), Disp: , Rfl:    nystatin-triamcinolone  (MYCOLOG II)  cream, nystatin-triamcinolone  100,000 unit/g-0.1 % topical cream (Patient not taking: Reported on 04/28/2021), Disp: , Rfl:    Omega-3 Fatty Acids (FISH OIL) 1000 MG CAPS, Take 1 capsule by mouth daily., Disp: , Rfl:    pravastatin  (PRAVACHOL ) 80 MG tablet, Take 1 tablet (80 mg total) by mouth daily. (Patient not taking: Reported on 10/05/2023), Disp: 90 tablet, Rfl: 3   PRESCRIPTION MEDICATION, Medication Name: CASH Otic Powder Mix the following powders together at these concentrations: 1. Ciprofloxacin                  30mg  2. Amphotericin B               5mg  3. Sulfacetamide Sodium  50mg  4. Hydrocortisone               25mg  Place mixed powders into #3 gelatin capsules. Keep capsules refrigerated. Dispense: #90 capsules Sig: Separate capsule. Place longer end of capsule into stem of OtoMed Powder Insufflator.        Administer 2 puffs of powder, in ear(s) with mastoid cavity twice weekly (Wed & Sat)., Disp: , Rfl:    Psyllium (QC NATURAL VEGETABLE) 95 % POWD, Take by mouth., Disp: , Rfl:    rosuvastatin (CRESTOR) 10 MG tablet, SMARTSIG:1 Tablet(s) By Mouth Every Evening, Disp: , Rfl:    triamcinolone  cream (KENALOG ) 0.1 %, Apply 1 application topically daily., Disp: 80 g, Rfl: 11   Zinc Sulfate (ZINC 15 PO), Take 1 tablet by mouth once as needed., Disp: , Rfl:       Objective:   Vitals:   10/05/23 0826  BP: 122/76  Pulse: 60  SpO2: 98%  Weight: 172 lb 4.8 oz (78.2 kg)  Height: 6' 2.5 (1.892 m)    Estimated body mass index is 21.83 kg/m as calculated from the following:   Height as of this encounter: 6' 2.5 (1.892 m).   Weight as of this encounter: 172 lb 4.8 oz (78.2 kg).  @WEIGHTCHANGE @  [employer]   10/05/23 0826  Weight: 172 lb 4.8 oz (78.2 kg)     Physical Exam   General: No distress. Looks well O2 at rest: no Cane present: no Sitting in wheel chair: no Frail: no Obese: no Neuro: Alert and Oriented x 3. GCS 15. Speech normal Psych: Pleasant Resp:  Barrel Chest -  no.  Wheeze - no, Crackles - no, No overt respiratory distress CVS: Normal heart sounds. Murmurs - no Ext: Stigmata of Connective Tissue Disease - no HEENT: Normal upper airway. PEERL +. 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        Assessment:       ICD-10-CM   1. Chronic cough  R05.3 Pulmonary function test    Perennial allergen profile IgE    CBC w/Diff    POCT EXHALED NITRIC OXIDE         Plan:     Patient Instructions  Chronic Cough  -need to rule out allergic asthma  Plan - wear respirator in attic - can try trelegy PRN 1h  before going into attick   -  this Is not the ideal inhaler for this but is to test things out  - check RAST allergy panel 10/05/2023 - check FENO test 10/05/2023 - do full PFT  < 3 months   Multiple lung nodules on CT  - rsolved on June 2022 CT chest compared to feb 2022 ct chest - no issue  June 2024 CT  Plan  - no further followup  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    SIGNATURE    Dr. Maire Scot, M.D., F.C.C.P,  Pulmonary and Critical Care Medicine Staff Physician, [facility] Center Director - Interstitial Lung Disease  Program  [facility] at [facility] [place], [place], [address]  Pager: [phone], If no answer or between  15:00h - 7:00h: call [phone] Telephone: [phone]  9:09 AM 10/05/2023

## 2023-10-05 NOTE — Addendum Note (Signed)
 Addended by: Eve Rey N on: 10/05/2023 09:09 AM   Modules accepted: Orders

## 2023-10-05 NOTE — Addendum Note (Signed)
 Addended by: Shana Zavaleta N on: 10/05/2023 12:17 PM   Modules accepted: Orders

## 2023-10-05 NOTE — Patient Instructions (Addendum)
 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

## 2023-10-08 LAB — ALLERGEN PROFILE, PERENNIAL ALLERGEN IGE

## 2023-12-26 NOTE — Patient Instructions (Incomplete)
 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

## 2023-12-26 NOTE — Progress Notes (Unsigned)
 IOV 03/17/2019  Chief Complaint  Patient presents with   Consult    Referred by Dr. Larnell due to asbestos exposure. Pt states he has an occ cough with occ clear phlegm that pt says he has had for years.       [place] Integrated Comprehensive Questionnaire  Symptoms: 73 year old male reports a chronic history of shortness of breath and cough of mild to moderate intensity that is stable since its onset.  On the questionnaire he reports onset of dyspnea 1972.  He says several years ago he had a chest x-ray done by Dr. Tish  who was then his primary care physician and there was some changes in the lung that were consistent with him living in the [place].  He says he grew up in [place] .  He was also told at that time that the changes were consistent with him having birds in the house.  He said he had pet parakeets in the house.  He says he has mild to moderate dyspnea while climbing up mountains or doing hikes.  It is not necessarily worse.  He says he wants to get to the bottom of all this.  Shortness of breath made worse by exertion and relieved by rest.  Things like bending over and any mask over the face does make him more winded.  He does endorse a mild cough for the last 20 or 30 years since it started it is the same.  He does clear his throat a lot.  He just brings up some phlegm.  It is clear.  There is no hemoptysis or cough when he lies down or cough affecting his voice.  There is no tickle in the back of the throat.      Past Medical History :   -Denies any asthma or COPD or heart failure rheumatoid arthritis or scleroderma or lupus or polymyositis or Sjogren's.  Denies any acid reflux or hiatal hernia.  Denies any immune system disorder.  Denies pulmonary hypertension.  Denies stroke denies seizures.  Denies hepatitis.  Denies tuberculosis.  Denies blood clots denies heart disease.  Several decades ago he had pleurisy.  He had pneumonia a few decades ago.   He has had thyroid  issues for several years and sleep apnea for not otherwise specified.   ROS: Positive for fatigue and arthralgia in the hands and the back.  For several decades he does notice pain no color change white-red in the fingers with cold weather.  And he does snore for the few decades.  But denies any ulcers or rash or nausea vomiting or recurrent fever or dysphagia   FAMILY HISTORY of LUNG DISEASE: * -Denies pulmonary fibrosis or COPD or asthma sarcoidosis or cystic fibrosis or hypersensitivity pneumonitis or autoimmune disease.  His mother-in-law is my patient.   EXPOSURE HISTORY:  -Denies smoking any cigarettes or cigars or pipe.  No marijuana use.  No vaping no cocaine no intravenous drug use.   HOME and HOBBY DETAILS :  -[place] the last 32 years.  Age of the home is 46 years.  There is dampness in the laundry room with a leak.  There might be some mold or mildew in the shower curtains.  There is recent mold or mildew in the shower.  No humidifier use.  Does use CPAP but there is no mold in it.  Does not have a nebulizer machine.  No Jacuzzi use.  No  misting [place] inside the house.  He did have a pet bird between the age of 13-18 years.  He does use a feather pillow.  He does not know of any mold in the [employer] duct.  He does do gardening with compost, woodchips mulch and flowers.  Overall a lot of hypersensitive pneumonitis type of exposures.   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  He did have a bird as a child.  He does use feather pillow.  Did have a parakeet as a child.  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  Some woodwork at [employer] were rewiring at home.   PULMONARY TOXICITY HISTORY (27 items):  -Denies.  There is no imaging to look at.     03/22/2020  - Visit   73 year old male never smoker followed in our office by Dr.  Geronimo for dyspnea on exertion.  Patient was last seen in our office in March/2021 by EW NP.  Plan of care from that office visit was as follows: Reviewed high-resolution CT chest that showed no evidence of ILD or spaces related pleural disease.  Some small scattered solid pulmonary nodule.  Pulmonary function testing was reviewed that showed no evidence of restriction or obstructive lung disease.  He was encouraged to remain on oral antihistamine as well as nasal steroid seasonally for his upper airway cough syndrome and continue follow-up with the ENT.  Patient was encouraged and scheduled to have this follow-up visit today at the request of primary care provider Dr. Larnell.  Patient completed a CT angio of his chest on 03/17/2020.  She had a new 12 x 9 mm subpleural nodule with irregular margins in the right upper lobe.  Is unsure if this represents focal inflammation and possible neoplasm.  Recommending follow-up chest CT in 3 to 4 weeks to ensure stability or resolution.  Patient reporting today that he is on ongoing shortness of breath with exertion and right-sided chest pain with movement for the last 2 weeks. He has an ongoing cough which is clear.  Reviewed office notes from primary care in [facility] Dr. Larnell. Patient was seen on 03/16/2020. This was a work in visit. Reporting pain when breathing as well as increased lung pain ever since traveling back from [facility] [place] . D-dimer was elevated at that time at 0.66. Given patient's age this is actually considered a normal result.  Patient reports that he has sternal chest pain whenever he takes a deep breath in.  He reports this initially happened when he was in [place]  then it went away.  Then a few days later he was raking leaves and the symptoms came back.  This is what prompted the visit with primary care.  He still occasionally has this pain especially when taking a full deep breath.  No significant family  history of interstitial lung disease or connective tissue disorders.  Father did have tuberculosis is a young adult and did require pneumonectomy.  Mother did die of lung cancer and she was a never smoker.  Patient presents today with his spouse with multiple questions regarding follow-up and work-up.  We will discuss this today.  Patient reports he never developed a fever or had any sort of productive cough.  He was never treated with antibiotics.  Patient was walked in office today was able to complete 3 laps of the desaturations or tachycardia.  Questionaires / Pulmonary Flowsheets:   ACT:  No flowsheet data found.  MMRC: No flowsheet data found.  Epworth:  No flowsheet data found.  Tests:   05/06/2019-CT chest high-res-no evidence of interstitial lung disease, no evidence of asbestos related pleural disease, scattered small solid pulmonary nodules largest being 4 mm, no follow-up needed if patient is low risk, one-vessel coronary arthrosclerosis  08/17/2015-CT maxillofacial-paranasal sinuses clear aside from minimal mucosal edema at the ostium of the maxillary sinus bilaterally   Dec 2021   04/12/2020 Follow up : Lung nodules  Patient returns for a 1 month follow-up.  Patient was seen last visit for an abnormal CT chest with lung nodule.  Patient was seen initially for a pulmonary consult November 2020 for shortness of breath with activity.  Work-up showed a high-resolution CT chest that was negative for interstitial lung disease.  It showed scattered small solid pulmonary nodules largest 4 mm.  Pulmonary function testing was normal.  And walk test in the office showed no hypoxia with ambulation.    Patient had recent trip to [facility] [place] , had sudden onset of dyspnea and chest tightness. Seen by PCP, D Dimer was positive . A CT chest was done March 17, 2020 that was negative for PE.  Mild biapical scarring.  A new 12 x 9 mm subpleural nodule noted with irregular margins in the  right upper lobe.  Stable irregular density with calcifications in the right posterior lung consistent with scarring or calcified granuloma. Says that his chest tightness resolved.   A repeat CT chest was done April 09, 2020 that showed no significant interval change in right upper lobe nodule.  Measuring 13 x 7 mm.  Associated pleural thickening and pleural parenchymal scarring.  2 new nodules in the right upper lobe measuring up to 8 mm with irregular margins.  Left lung was clear.  CT impression findings were favored to represent a possible infectious or inflammatory process given the rapid interval development. We discussed his CT results. Minimal dry cough . No Covid infection history . Declines vaccine currently.  15-20 years dry cough and some dyspnea with heavy exercise.  Patient is active does some exercising and walking.  Occasional hiking and biking.  Is able to complete activity but feels more short of breath with heavy activity. No increased cough,congestion,  Has noted rash spots on leg.  Went to the dermatologist and prescribed a cream but rash did not resolve.  It is nonpruritic.  Mild hyper pigmentation. FH + mother died lung cancer - never smoker .  Patient denies any unintentional weight loss.  No hemoptysis.   04/12/2020 Follow up : Lung nodules  Patient returns for a 1 month follow-up.  Patient was seen last visit for an abnormal CT chest with lung nodule.  Patient was seen initially for a pulmonary consult November 2020 for shortness of breath with activity.  Work-up showed a high-resolution CT chest that was negative for interstitial lung disease.  It showed scattered small solid pulmonary nodules largest 4 mm.  Pulmonary function testing was normal.  And walk test in the office showed no hypoxia with ambulation.    Patient had recent trip to [facility] [place] , had sudden onset of dyspnea and chest tightness. Seen by PCP, D Dimer was positive . A CT chest was done March 17, 2020  that was negative for PE.  Mild biapical scarring.  A new 12 x 9 mm subpleural nodule noted with irregular margins in the right upper lobe.  Stable irregular density with calcifications in  the right posterior lung consistent with scarring or calcified granuloma. Says that his chest tightness resolved.   A repeat CT chest was done April 09, 2020 that showed no significant interval change in right upper lobe nodule.  Measuring 13 x 7 mm.  Associated pleural thickening and pleural parenchymal scarring.  2 new nodules in the right upper lobe measuring up to 8 mm with irregular margins.  Left lung was clear.  CT impression findings were favored to represent a possible infectious or inflammatory process given the rapid interval development. We discussed his CT results. Minimal dry cough . No Covid infection history . Declines vaccine currently.  15-20 years dry cough and some dyspnea with heavy exercise.  Patient is active does some exercising and walking.  Occasional hiking and biking.  Is able to complete activity but feels more short of breath with heavy activity. No increased cough,congestion,  Has noted rash spots on leg.  Went to the dermatologist and prescribed a cream but rash did not resolve.  It is nonpruritic.  Mild hyper pigmentation. FH + mother died lung cancer - never smoker .  Patient denies any unintentional weight loss.  No hemoptysis.    OV 06/03/2020  Subjective:  Patient ID: Nancyann KANDICE Berth, male , DOB: [birth year] , age 28 y.o. , MRN: [id number] , ADDRESS: [address] [place] [address] PCP Larnell Hamilton, MD Patient Care Team: Larnell Hamilton, MD as PCP - General (Internal Medicine) Livingston Rigg, MD as Consulting Physician (Dermatology)  This Provider for this visit: Treatment Team:  Attending Provider: Geronimo Amel, MD    06/03/2020 -   Chief Complaint  Patient presents with   Follow-up    Pt states he has been doing good since last visit. Pt had a recent CT  performed and is here to discuss results.    Follow-up multiple lung nodules HPI Mike Stephens 46 y.o. -returns for follow-up.  I used to see him for shortness of breath and then he is seen nurse practitioner.  In between he developed nodules.  Most recently saw Tammy ferritin December 2021 and he had nodules.  Says had a follow-up CT chest.  All nodules are gone.  Insidious new nodules that look quite inflammatory.  Some of them were greater than 1 cm.  I personally visualized this with him.  He is here with his wife.  Is quite concerned about these nodules.  We went over different histories.  There is nothing to suggest connective tissue disease.  There is no postnasal drip of poor dentition history there is no acid reflux history.  On the other hand he tells me that he uses firewood for heating in the winter because he enjoys it.  He also has an attic with her my straps.  He goes to the attic daily.  He does not wear a mask.  The attic is dusty.  Sometimes there is mold in it.  He also uses CPAP machine has not been clean for a long time.  Other than that there are no other exposures.  No tuberculosis history.   CT Chest data  CT Chest Wo Contrast  Result Date: 06/02/2020 CLINICAL DATA:  Pulmonary nodules. EXAM: CT CHEST WITHOUT CONTRAST TECHNIQUE: Multidetector CT imaging of the chest was performed following the standard protocol without IV contrast. COMPARISON:  04/09/2020 FINDINGS: Cardiovascular: The heart size is normal. No substantial pericardial effusion. Coronary artery calcification is evident. Atherosclerotic calcification is noted in the wall of the thoracic aorta. Mediastinum/Nodes:  No mediastinal lymphadenopathy. Calcified nodal tissue is seen in the subcarinal station and right hilum consistent with granulomatous disease. There is no axillary lymphadenopathy. The esophagus has normal imaging features. Lungs/Pleura: Biapical pleuroparenchymal scarring again noted. 8 mm right upper lobe  pulmonary nodule seen on the prior study has almost completely resolved with only some trace residual visible on image 78/5 today. The second adjacent 8 mm nodule identified as new on the prior study has resolved completely in the interval. 7 mm nodule in the lingula (114/5) is new since prior. 15 mm left lower lobe nodule on 143/5 is new in the interval. New 11 mm irregular pulmonary nodule identified right upper lobe on 63/5. 7 mm right upper lobe nodule on 54/5 is new since prior. Calcified granulomata again noted medial right costophrenic sulcus.No focal airspace consolidation. No pleural effusion. Upper Abdomen: Unremarkable. Musculoskeletal: No worrisome lytic or sclerotic osseous abnormality. IMPRESSION: 1. The 2 right upper lobe pulmonary nodules seen as new on the prior study have almost completely resolved completely in the interval. 2. Multiple new bilateral pulmonary nodules measuring up to 15 mm. Rapid interval appearance suggests infectious/inflammatory etiology. Non-contrast chest CT at 3-6 months is recommended. If the nodules are stable at time of repeat CT, then future CT at 18-24 months (from today's scan) is considered optional for low-risk patients, but is recommended for high-risk patients. This recommendation follows the consensus statement: Guidelines for Management of Incidental Pulmonary Nodules Detected on CT Images: From the Fleischner Society 2017; Radiology 2017; 284:228-243. 3. Calcified granulomatous disease. 4. Aortic Atherosclerosis (ICD10-I70.0). Electronically Signed   By: Camellia Candle M.D.   On: 06/02/2020 09:24   OV 10/04/2020  Subjective:  Patient ID: Nancyann KANDICE Berth, male , DOB: 26-Apr-1950 , age 61 y.o. , MRN: [id number] , ADDRESS: [address] [place] [address] PCP Larnell Hamilton, MD Patient Care Team: Larnell Hamilton, MD as PCP - General (Internal Medicine) Livingston Rigg, MD as Consulting Physician (Dermatology)  This Provider for this visit: Treatment Team:   Attending Provider: Geronimo Amel, MD    10/04/2020 -   Chief Complaint  Patient presents with   Follow-up    Pt states he has been doing well since last visit and denies any complaints. Pt had recent CT scan performed and is here today to go over the results.     HPI Mike Stephens 89 y.o. -returns for follow-up of his pulmonary nodule.  He presents with his wife.  I also take care of his mother-in-law who is in her 2s.  He is asymptomatic.  His CT scan of the chest showed- - Resolved pulmonary nodule - Coronary artery calcification.  Last stress test was many years ago.  Denies any chest pain - Calcified mediastinal adenopathy: He grew up [place] - Bilateral chronic apical lung scarring not consistent with ILD but radiology recommending 1 year follow-up.  He feels asymptomatic and is more open to clinical expectant follow-up.    CT Chest data 09/27/20  Narrative & Impression  CLINICAL DATA:  73 year old male with history of pulmonary nodule. Followup study.   EXAM: CT CHEST WITHOUT CONTRAST   TECHNIQUE: Multidetector CT imaging of the chest was performed following the standard protocol without IV contrast.   COMPARISON:  Chest CT 06/01/2020.   FINDINGS: Cardiovascular: Heart size is normal. There is no significant pericardial fluid, thickening or pericardial calcification. There is aortic atherosclerosis, as well as atherosclerosis of the great vessels of the mediastinum and the coronary arteries, including  calcified atherosclerotic plaque in the left anterior descending coronary artery.   Mediastinum/Nodes: No pathologically enlarged mediastinal or hilar lymph nodes. Multiple densely calcified bilateral hilar lymph nodes are incidentally noted. Please note that accurate exclusion of hilar adenopathy is limited on noncontrast CT scans. Esophagus is unremarkable in appearance. No axillary lymphadenopathy.   Lungs/Pleura: The new pulmonary nodule seen on  the most recent prior examination have essentially resolved with some very subtle areas of very mild ground-glass attenuation in their wake, indicative of a benign infectious or inflammatory etiology on the prior study. No other new suspicious appearing pulmonary nodules or masses are noted. There continues to be extensive bilateral apical nodular pleuroparenchymal thickening and architectural distortion, similar to prior studies dating back to May 06, 2019, most compatible with chronic post infectious or inflammatory scarring, with the largest nodular area in the right upper lobe posteriorly near the apex (axial image 39 of series 5) measuring 9 x 4 mm, favored to be benign. No acute consolidative airspace disease. No pleural effusions. Calcified granuloma in the right lower lobe incidentally noted.   Upper Abdomen: Aortic atherosclerosis.   Musculoskeletal: There are no aggressive appearing lytic or blastic lesions noted in the visualized portions of the skeleton.   IMPRESSION: 1. The previously noted pulmonary nodules of concern have essentially completely resolved, indicative of benign infectious or inflammatory nodules on the prior study. 2. There continues to be nodular areas of bilateral pleuroparenchymal thickening and architectural distortion in the apices of both lungs, strongly favored to be benign. Repeat noncontrast chest CT is recommended in 12 months to ensure continued stability. This recommendation follows the consensus statement: Guidelines for Management of Incidental Pulmonary Nodules Detected on CT Images: From the Fleischner Society 2017; Radiology 2017; 284:228-243. 3. Aortic atherosclerosis, in addition to left anterior descending coronary artery disease. Please note that although the presence of coronary artery calcium  documents the presence of coronary artery disease, the severity of this disease and any potential stenosis cannot be assessed on this  non-gated CT examination. Assessment for potential risk factor modification, dietary therapy or pharmacologic therapy may be warranted, if clinically indicated.   Aortic Atherosclerosis (ICD10-I70.0).     Electronically Signed   By: Toribio Aye M.D.   On: 09/28/2020 08:53    No results found.   OV 10/05/2023 -> it is exactly 3 years and 1 day since I last saw him therefore this is a new visit per Medicare criteria.  Subjective:  Patient ID: Nancyann KANDICE Berth, male , DOB: [birth year] , age 57 y.o. , MRN: [id number] , ADDRESS: [address] [place] [address] PCP Larnell Hamilton, MD Patient Care Team: Larnell Hamilton, MD as PCP - General (Internal Medicine) Livingston Rigg, MD (Inactive) as Consulting Physician (Dermatology)  This Provider for this visit: Treatment Team:  Attending Provider: Geronimo Amel, MD    10/05/2023 -   Chief Complaint  Patient presents with   Acute Visit    Coughing it a dry that has come back.he notice that itchy and cough after upstairs think he is having a reaction      HPI Mike Stephens 58 y.o. -new consult because it is 3 years and 1 day since I last saw him.  I last saw him on October 04, 2020.  He is here because of new onset of chronic cough.  It was not there at the time of last visit.  It is mild and intermittent.  It happens only when he goes of his attic and  he comes down.  It is mild and transient and last 1 day or a few hours and then goes away.  When he wears a respirator the cough is less or not.  He says that there is no visible dust or mold but the place was built in the 1970s and he suspects this might be something there.  Otherwise he has no shortness of breath or wheezing.  He has no nocturnal awakenings.  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  IMPRESSION: - June June 202024. 1. No findings to suggest interstitial lung disease. 2.  Stable chronic nodular pleuroparenchymal thickening in the lung apices similar to numerous prior examinations, considered definitively benign requiring no future imaging follow-up. 3. Old granulomatous disease, as above. 4. Aortic atherosclerosis, in addition to left anterior descending coronary artery disease. Please note that although the presence of coronary artery calcium  documents the presence of coronary artery disease, the severity of this disease and any potential stenosis cannot be assessed on this non-gated CT examination. Assessment for potential risk factor modification, dietary therapy or pharmacologic therapy may be warranted, if clinically indicated.   Aortic Atherosclerosis (ICD10-I70.0).     Electronically Signed   By: Toribio Aye M.D.   On: 10/04/2022 08:24   OV 12/26/2023  Subjective:  Patient ID: Nancyann KANDICE Berth, male , DOB: Aug 18, 1950 , age 67 y.o. , MRN: [id number] , ADDRESS: [address] [place] [address] PCP Larnell Hamilton, MD Patient Care Team: Larnell Hamilton, MD as PCP - General (Internal Medicine) Livingston Rigg, MD as Consulting Physician (Dermatology)  This Provider for this visit: Treatment Team:  Attending Provider: Geronimo Amel, MD    12/26/2023 -  No chief complaint on file.    HPI Mike Stephens 20 y.o. -      SYMPTOM SCALE - ILD 03/17/2019  10/05/2023 fENO 21 pbb and normal  O2 use RA   Shortness of Breath 0 -> 5 scale with 5 being worst (score 6 If unable to do) 0  At rest 0 0  Simple tasks - showers, clothes change, eating, shaving 1 0  Household (dishes, doing bed, laundry) 1 0  Shopping 2 0  Walking level at own pace 2 0  Walking keeping up with others of same age 29 0  Walking up Stairs 4 0  Walking up [place] 5 0  Total (40 - 48) Dyspnea Score 17 0  How bad is your cough? mild 0 mild intermitten  How bad is your fatigue x 0       Simple office walk 185 feet x  3 laps goal with forehead probe 03/17/2019   O2  used ra  Number laps completed 3  Comments about pace normal  Resting Pulse Ox/HR 100% and 62/min  Final Pulse Ox/HR 100% and 80/min  Desaturated </= 88% no  Desaturated <= 3% points no  Got Tachycardic >/= 90/min no  Symptoms at end of test Very mild if any dyspnea  Miscellaneous comments Normal test          SIT STAND TEST - goal 15 times   10/05/2023    O2 used ra   PRobe - finter or forehead finger   Number sit and stand completed - goal 15 15   Time taken to complete 1   Resting Pulse Ox/HR/Dyspnea  98% and 60/min and dyspnea of 0/10    Peak  measures 98 % and 85/min and dyspnea of 0/10   Final Pulse Ox/HR 100% and 65/min and dyspnea of 0/10   Desaturated </= 88% no   Desaturated <= 3% points no   Got Tachycardic >/= 90/min no   Miscellaneous comments no      CT Chest data from date: ****  - personally visualized and independently interpreted : *** - my findings are: ***   Latest Reference Range & Units 10/05/23 09:16  Class Description Allergens  Comment  D Pteronyssinus IgE Class 0 kU/L <0.10  D Farinae IgE Class 0 kU/L <0.10  Cat Dander IgE Class 0 kU/L <0.10  Dog Dander IgE Class 0 kU/L <0.10  Penicillium Chrysogen IgE Class 0 kU/L <0.10  Cladosporium Herbarum IgE Class 0 kU/L <0.10  Aspergillus Fumigatus IgE Class 0 kU/L <0.10  Mucor Racemosus IgE Class 0 kU/L <0.10  Alternaria Alternata IgE Class 0 kU/L <0.10  Stemphylium Herbarum IgE Class 0 kU/L <0.10  Goose Feathers IgE Class 0 kU/L <0.10  Chicken Feathers IgE Class 0 kU/L <0.10  Duck Feathers IgE Class 0 kU/L <0.10  Mouse Urine IgE Class 0 kU/L <0.10   PFT     Latest Ref Rng & Units 07/08/2019   10:50 AM  PFT Results  FVC-Pre L 5.16   FVC-Predicted Pre % 95   FVC-Post L 5.22   FVC-Predicted Post % 96   Pre FEV1/FVC % % 77   Post FEV1/FCV % % 78   FEV1-Pre L 3.99   FEV1-Predicted Pre % 99   FEV1-Post L 4.06   DLCO uncorrected ml/min/mmHg 28.72   DLCO UNC% % 93   DLCO corrected  ml/min/mmHg 28.72   DLCO COR %Predicted % 93   DLVA Predicted % 106   TLC L 7.53   TLC % Predicted % 93   RV % Predicted % 100        LAB RESULTS last 96 hours No results found.       has a past medical history of Benign prostatic hypertrophy, Diverticulosis, Esophageal abnormality, Hearing loss in left ear, Heart murmur, History of mitral valve prolapse, Hyperlipidemia, Hypertension, Hypothyroidism, OSA (obstructive sleep apnea), PONV (postoperative nausea and vomiting), Sleep apnea, and Tubular adenoma of colon (01/2008).   reports that he has never smoked. He has never used smokeless tobacco.  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   TONSILLECTOMY     WISDOM TOOTH EXTRACTION      Allergies  Allergen Reactions   Other     Anesthetic    Immunization History  Administered Date(s) Administered   Hepatitis A 01/03/2005, 07/21/2005   Hepatitis B 01/03/2005, 02/07/2005, 07/03/2005   INFLUENZA, HIGH DOSE SEASONAL PF 03/16/2019   Influenza Split 04/24/2012, 02/03/2014, 02/20/2020   Influenza, Quadrivalent, Recombinant, Inj, Pf 02/12/2020   Pneumococcal Conjugate-13 03/27/2018   Pneumococcal Polysaccharide-23 04/15/2019, 05/24/2020   Td 10/31/2004   Zoster Recombinant(Shingrix) 01/22/2017   Zoster, Live 04/24/2010, 12/22/2010, 03/30/2017    Family  History  Problem Relation Age of Onset   Tuberculosis Father        in college   Hypertension Mother    Coronary artery disease Maternal Uncle        died in sleep, > 69   Cancer Paternal Grandmother         ? primary   Colon cancer Maternal Uncle     Stroke Neg Hx    Colon polyps Neg Hx    Rectal cancer Neg Hx    Stomach cancer Neg Hx    Esophageal cancer Neg Hx      Current Outpatient Medications:    aspirin 81 MG tablet, Take 81 mg by mouth daily., Disp: , Rfl:    Cholecalciferol (VITAMIN D3) 2000 UNITS TABS, Take 2 tablets by mouth daily., Disp: , Rfl:    Coenzyme Q10 (COQ10) 100 MG CAPS, Take 1 capsule by mouth daily., Disp: , Rfl:    doxazosin  (CARDURA ) 8 MG tablet, TAKE ONE-FOURTH TABLET BY MOUTH EVERY DAY, Disp: 90 tablet, Rfl: 0   hydrocortisone  2.5 % ointment, APPLY TO AFFECTED AREA AT BEDTIME AS DIRECTED, Disp: 20 g, Rfl: 11   levothyroxine  (SYNTHROID , LEVOTHROID) 25 MCG tablet, TAKE ONE AND ONE-HALF TABLETS BY MOUTH ONCE DAILY,EXCEPT TAKE 1 TABLET BY MOUTH ON WEDNESDAY (Patient taking differently: 1 /12 tablets daily), Disp: 120 tablet, Rfl: 0   Magnesium 250 MG TABS, 1 tablet with a meal Orally Once a day, Disp: , Rfl:    Multiple Vitamins-Minerals (MENS 50+ MULTI VITAMIN/MIN) TABS, Take 1 tablet by mouth daily., Disp: , Rfl:    neomycin-polymyxin-hydrocortisone  (CORTISPORIN) 3.5-10000-1 OTIC suspension, neomycin-polymyxin-hydrocort  3.5 mg-10,000 unit/mL-1 % ear drops,susp (Patient not taking: Reported on 04/28/2021), Disp: , Rfl:    nystatin-triamcinolone  (MYCOLOG II) cream, nystatin-triamcinolone  100,000 unit/g-0.1 % topical cream (Patient not taking: Reported on 04/28/2021), Disp: , Rfl:    Omega-3 Fatty Acids (FISH OIL) 1000 MG CAPS, Take 1 capsule by mouth daily., Disp: , Rfl:    pravastatin  (PRAVACHOL ) 80 MG tablet, Take 1 tablet (80 mg total) by mouth daily. (Patient not taking: Reported on 10/05/2023), Disp: 90 tablet, Rfl: 3   PRESCRIPTION MEDICATION, Medication Name: CASH Otic Powder Mix the following powders together at these concentrations: 1. Ciprofloxacin                  30mg  2. Amphotericin B               5mg  3. Sulfacetamide Sodium  50mg  4. Hydrocortisone               25mg  Place mixed powders into #3 gelatin  capsules. Keep capsules refrigerated. Dispense: #90 capsules Sig: Separate capsule. Place longer end of capsule into stem of OtoMed Powder Insufflator.        Administer 2 puffs of powder, in ear(s) with mastoid cavity twice weekly (Wed & Sat)., Disp: , Rfl:    Psyllium (QC NATURAL VEGETABLE) 95 % POWD, Take by mouth., Disp: , Rfl:    rosuvastatin (CRESTOR) 10 MG tablet, SMARTSIG:1 Tablet(s) By Mouth Every Evening, Disp: , Rfl:    triamcinolone  cream (KENALOG ) 0.1 %, Apply 1 application topically daily., Disp: 80 g, Rfl: 11   Zinc Sulfate (ZINC 15 PO), Take 1 tablet by mouth once as needed., Disp: , Rfl:       Objective:   There were no vitals filed for this visit.  Estimated body mass index is 21.83 kg/m as calculated from the following:   Height as of 10/05/23:  6' 2.5 (1.892 m).   Weight as of 10/05/23: 172 lb 4.8 oz (78.2 kg).  @WEIGHTCHANGE @  There were no vitals filed for this visit.   Physical Exam   General: No distress. *** O2 at rest: *** Cane present: *** Sitting in wheel chair: *** Frail: *** Obese: *** Neuro: Alert and Oriented x 3. GCS 15. Speech normal Psych: Pleasant Resp:  Barrel Chest - ***.  Wheeze - ***, Crackles - ***, No overt respiratory distress CVS: Normal heart sounds. Murmurs - *** Ext: Stigmata of Connective Tissue Disease - *** HEENT: Normal upper airway. PEERL +. 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    SIGNATURE    Dr. Dorethia Cave, M.D., F.C.C.P,  Pulmonary and Critical Care Medicine Staff Physician, [facility] Director - Interstitial Lung Disease  Program  Pulmonary Fibrosis [facility] at [facility] [place], [place], [address]  Pager: [phone], If no answer or between  15:00h - 7:00h: call [phone] Telephone: [phone]  5:15 PM 12/26/2023   Moderate Complexity MDM OFFICE  2021 E/M guidelines, first released in 2021, with minor revisions added in 2023 and 2024 Must meet the requirements for 2 out of 3 dimensions to qualify.    Number and complexity of problems addressed Amount and/or complexity of data reviewed Risk of complications and/or morbidity  One or more chronic illness with mild exacerbation, OR progression, OR  side effects of treatment  Two or more stable chronic illnesses  One undiagnosed new problem with uncertain prognosis  One acute illness with systemic symptoms   One Acute complicated injury Must meet the requirements for 1 of 3 of the categories)  Category 1: Tests and documents, historian  Any combination of 3 of the following:  Assessment requiring an independent historian  Review of prior external note(s) from each unique source  Review of results of each unique test  Ordering of each unique test    Category 2: Interpretation of tests   Independent interpretation of a test performed by another physician/other qualified health care professional (not separately reported)  Category 3: Discuss  management/tests  Discussion of management or test interpretation with external physician/other qualified health care professional/appropriate source (not separately reported) Moderate risk of morbidity from additional diagnostic  testing or treatment Examples only:  Prescription drug management  Decision regarding minor surgery with identfied patient or procedure risk factors  Decision regarding elective major surgery without identified patient or procedure risk factors  Diagnosis or treatment significantly limited by social determinants of health             HIGh Complexity  OFFICE   2021 E/M guidelines, first released in 2021, with minor revisions added in 2023. Must meet the requirements for 2 out of 3 dimensions to qualify.    Number and complexity of problems addressed Amount and/or complexity of data reviewed Risk of complications and/or morbidity  Severe exacerbation of chronic illness  Acute or chronic illnesses that may pose a threat to life or bodily function, e.g., multiple trauma, acute MI, pulmonary embolus, severe respiratory distress, progressive rheumatoid arthritis, psychiatric illness with potential threat to self or others, peritonitis, acute renal failure, abrupt change in neurological status Must meet the requirements for 2 of 3 of the categories)  Category 1: Tests and documents, historian  Any combination of 3 of the following:  Assessment requiring an independent historian  Review of prior external note(s) from each unique source  Review of results of each unique test  Ordering of each unique test    Category 2: Interpretation of tests    Independent interpretation of a test performed by another physician/other qualified health care professional (not separately reported)  Category 3: Discuss management/tests  Discussion of management or test interpretation with external physician/other qualified health care professional/appropriate source  (not separately reported)  HIGH risk of morbidity from additional diagnostic testing or treatment Examples only:  Drug therapy requiring intensive monitoring for toxicity  Decision for elective major surgery with identified pateint or procedure risk factors  Decision regarding hospitalization or escalation of level of care  Decision for DNR or to de-escalate care   Parenteral controlled  substances            LEGEND - Independent interpretation involves the interpretation of a test for which there is a CPT code, and an interpretation or report is customary. When a review and interpretation of a test is performed and documented by the provider, but not separately reported (billed), then this would represent an independent interpretation. This report does not need to conform to the usual standards of a complete report of the test. This does not include interpretation of tests that do not have formal reports such as a complete blood count with differential and blood cultures. Examples would include reviewing a chest radiograph and documenting in the medical record an interpretation, but not separately reporting (billing) the interpretation of the chest radiograph.   An appropriate source includes professionals who are not health care professionals but may be involved in the management of the patient, such as a Clinical research associate, upper officer, case manager or teacher, and does not include discussion with family or informal caregivers.    - SDOH: SDOH are the conditions in the environments where people are born, live, learn, work, play, worship, and age that affect a wide range of health, functioning, and quality-of-life outcomes and risks. (e.g., housing, food insecurity, transportation, etc.). SDOH-related Z codes ranging from Z55-Z65 are the ICD-10-CM diagnosis codes used to document SDOH data Z55 - Problems related to education and literacy Z56 - Problems related to employment  and unemployment Z57 - Occupational exposure to risk factors Z58 - Problems related to physical environment Z59 - Problems related to housing and economic circumstances [phone] - Problems related  to social environment [phone] - Problems related to upbringing [phone] - Other problems related to primary support group, including family circumstances Z22 - Problems related to certain psychosocial circumstances Z65 - Problems related to other psychosocial circumstances

## 2023-12-27 ENCOUNTER — Ambulatory Visit: Admitting: Internal Medicine

## 2023-12-27 ENCOUNTER — Encounter: Payer: Self-pay | Admitting: Internal Medicine

## 2023-12-27 VITALS — BP 118/64 | HR 61 | Ht 75.0 in | Wt 175.0 lb

## 2023-12-27 DIAGNOSIS — R053 Chronic cough: Secondary | ICD-10-CM

## 2023-12-27 LAB — PULMONARY FUNCTION TEST
DL/VA % pred: 89 %
DL/VA: 3.51 ml/min/mmHg/L
DLCO unc % pred: 85 %
DLCO unc: 25.62 ml/min/mmHg
FEF 25-75 Post: 3.68 L/s
FEF 25-75 Pre: 3.49 L/s
FEF2575-%Change-Post: 5 %
FEF2575-%Pred-Post: 126 %
FEF2575-%Pred-Pre: 119 %
FEV1-%Change-Post: 1 %
FEV1-%Pred-Post: 106 %
FEV1-%Pred-Pre: 105 %
FEV1-Post: 4.14 L
FEV1-Pre: 4.1 L
FEV1FVC-%Change-Post: 3 %
FEV1FVC-%Pred-Pre: 104 %
FEV6-%Change-Post: -1 %
FEV6-%Pred-Post: 104 %
FEV6-%Pred-Pre: 106 %
FEV6-Post: 5.21 L
FEV6-Pre: 5.32 L
FEV6FVC-%Change-Post: 0 %
FEV6FVC-%Pred-Post: 105 %
FEV6FVC-%Pred-Pre: 104 %
FVC-%Change-Post: -2 %
FVC-%Pred-Post: 99 %
FVC-%Pred-Pre: 101 %
FVC-Post: 5.21 L
FVC-Pre: 5.35 L
Post FEV1/FVC ratio: 79 %
Post FEV6/FVC ratio: 100 %
Pre FEV1/FVC ratio: 77 %
Pre FEV6/FVC Ratio: 99 %
RV % pred: 84 %
RV: 2.31 L
TLC % pred: 92 %
TLC: 7.42 L

## 2023-12-27 NOTE — Progress Notes (Signed)
 Full pft performed today

## 2023-12-27 NOTE — Patient Instructions (Signed)
 Full pft performed today

## 2024-01-23 DIAGNOSIS — Z23 Encounter for immunization: Secondary | ICD-10-CM | POA: Diagnosis not present

## 2024-02-06 DIAGNOSIS — Z1212 Encounter for screening for malignant neoplasm of rectum: Secondary | ICD-10-CM | POA: Diagnosis not present

## 2024-03-06 DIAGNOSIS — H7291 Unspecified perforation of tympanic membrane, right ear: Secondary | ICD-10-CM | POA: Diagnosis not present

## 2024-03-06 DIAGNOSIS — H7121 Cholesteatoma of mastoid, right ear: Secondary | ICD-10-CM | POA: Diagnosis not present

## 2024-03-06 DIAGNOSIS — H6123 Impacted cerumen, bilateral: Secondary | ICD-10-CM | POA: Diagnosis not present

## 2024-03-06 DIAGNOSIS — H7492 Unspecified disorder of left middle ear and mastoid: Secondary | ICD-10-CM | POA: Diagnosis not present

## 2024-03-06 DIAGNOSIS — Z48810 Encounter for surgical aftercare following surgery on the sense organs: Secondary | ICD-10-CM | POA: Diagnosis not present

## 2024-03-06 DIAGNOSIS — Z9889 Other specified postprocedural states: Secondary | ICD-10-CM | POA: Diagnosis not present

## 2024-03-06 DIAGNOSIS — H906 Mixed conductive and sensorineural hearing loss, bilateral: Secondary | ICD-10-CM | POA: Diagnosis not present

## 2024-03-06 DIAGNOSIS — Z9621 Cochlear implant status: Secondary | ICD-10-CM | POA: Diagnosis not present
